# Patient Record
Sex: Female | Born: 1950 | Race: Black or African American | Hispanic: No | Marital: Married | Smoking: Never smoker
Health system: Southern US, Community
[De-identification: ages and names within clinical notes are randomized; demographics above are authoritative.]

## PROBLEM LIST (undated history)

## (undated) DIAGNOSIS — N814 Uterovaginal prolapse, unspecified: Secondary | ICD-10-CM

## (undated) DIAGNOSIS — N816 Rectocele: Secondary | ICD-10-CM

## (undated) DIAGNOSIS — K5900 Constipation, unspecified: Secondary | ICD-10-CM

## (undated) DIAGNOSIS — H35349 Macular cyst, hole, or pseudohole, unspecified eye: Secondary | ICD-10-CM

## (undated) DIAGNOSIS — N813 Complete uterovaginal prolapse: Secondary | ICD-10-CM

## (undated) DIAGNOSIS — D649 Anemia, unspecified: Secondary | ICD-10-CM

## (undated) DIAGNOSIS — T7840XA Allergy, unspecified, initial encounter: Secondary | ICD-10-CM

## (undated) HISTORY — DX: Rectocele: N81.6

## (undated) HISTORY — PX: EYE SURGERY: SHX253

## (undated) HISTORY — DX: Anemia, unspecified: D64.9

## (undated) HISTORY — DX: Allergy, unspecified, initial encounter: T78.40XA

## (undated) HISTORY — DX: Macular cyst, hole, or pseudohole, unspecified eye: H35.349

## (undated) HISTORY — DX: Complete uterovaginal prolapse: N81.3

## (undated) HISTORY — DX: Uterovaginal prolapse, unspecified: N81.4

## (undated) HISTORY — DX: Constipation, unspecified: K59.00

---

## 2003-06-22 ENCOUNTER — Ambulatory Visit (HOSPITAL_COMMUNITY): Admission: RE | Admit: 2003-06-22 | Discharge: 2003-06-22 | Payer: Self-pay | Admitting: Family Medicine

## 2004-12-17 ENCOUNTER — Ambulatory Visit (HOSPITAL_COMMUNITY): Admission: RE | Admit: 2004-12-17 | Discharge: 2004-12-17 | Payer: Self-pay | Admitting: Internal Medicine

## 2006-03-13 ENCOUNTER — Ambulatory Visit (HOSPITAL_COMMUNITY): Admission: RE | Admit: 2006-03-13 | Discharge: 2006-03-13 | Payer: Self-pay | Admitting: Family Medicine

## 2007-05-14 ENCOUNTER — Ambulatory Visit (HOSPITAL_COMMUNITY): Admission: RE | Admit: 2007-05-14 | Discharge: 2007-05-14 | Payer: Self-pay | Admitting: Internal Medicine

## 2009-02-06 ENCOUNTER — Ambulatory Visit (HOSPITAL_COMMUNITY): Admission: RE | Admit: 2009-02-06 | Discharge: 2009-02-06 | Payer: Self-pay | Admitting: Internal Medicine

## 2010-02-06 ENCOUNTER — Ambulatory Visit (HOSPITAL_COMMUNITY): Admission: RE | Admit: 2010-02-06 | Discharge: 2010-02-07 | Payer: Self-pay | Admitting: Ophthalmology

## 2010-06-05 ENCOUNTER — Ambulatory Visit (HOSPITAL_COMMUNITY): Admission: RE | Admit: 2010-06-05 | Discharge: 2010-06-05 | Payer: Self-pay | Admitting: Internal Medicine

## 2010-08-20 ENCOUNTER — Encounter: Payer: Self-pay | Admitting: Internal Medicine

## 2010-10-14 LAB — CBC
HCT: 40.3 % (ref 36.0–46.0)
Hemoglobin: 13.4 g/dL (ref 12.0–15.0)
MCH: 28 pg (ref 26.0–34.0)
MCHC: 33.2 g/dL (ref 30.0–36.0)
MCV: 84.4 fL (ref 78.0–100.0)
Platelets: 215 10*3/uL (ref 150–400)
RBC: 4.77 MIL/uL (ref 3.87–5.11)
RDW: 14.4 % (ref 11.5–15.5)
WBC: 3.2 10*3/uL — ABNORMAL LOW (ref 4.0–10.5)

## 2010-10-14 LAB — SURGICAL PCR SCREEN
MRSA, PCR: NEGATIVE
Staphylococcus aureus: NEGATIVE

## 2010-10-14 LAB — AUTOLOGOUS SERUM PATCH PREP

## 2011-06-18 ENCOUNTER — Other Ambulatory Visit (HOSPITAL_COMMUNITY): Payer: Self-pay | Admitting: Internal Medicine

## 2011-06-18 DIAGNOSIS — Z1231 Encounter for screening mammogram for malignant neoplasm of breast: Secondary | ICD-10-CM

## 2011-07-19 ENCOUNTER — Ambulatory Visit (HOSPITAL_COMMUNITY)
Admission: RE | Admit: 2011-07-19 | Discharge: 2011-07-19 | Disposition: A | Discharge status: Home / Self Care | Payer: Medicaid Other | Source: Ambulatory Visit | Attending: Internal Medicine | Admitting: Internal Medicine

## 2011-07-19 DIAGNOSIS — Z1231 Encounter for screening mammogram for malignant neoplasm of breast: Secondary | ICD-10-CM | POA: Insufficient documentation

## 2012-11-24 ENCOUNTER — Other Ambulatory Visit (HOSPITAL_COMMUNITY): Payer: Self-pay | Admitting: Internal Medicine

## 2012-11-24 DIAGNOSIS — Z1231 Encounter for screening mammogram for malignant neoplasm of breast: Secondary | ICD-10-CM

## 2012-12-03 ENCOUNTER — Ambulatory Visit (HOSPITAL_COMMUNITY)
Admission: RE | Admit: 2012-12-03 | Discharge: 2012-12-03 | Disposition: A | Discharge status: Home / Self Care | Payer: Medicaid Other | Source: Ambulatory Visit | Attending: Internal Medicine | Admitting: Internal Medicine

## 2012-12-03 DIAGNOSIS — Z1231 Encounter for screening mammogram for malignant neoplasm of breast: Secondary | ICD-10-CM | POA: Insufficient documentation

## 2013-02-18 ENCOUNTER — Encounter: Payer: Self-pay | Admitting: Obstetrics & Gynecology

## 2013-02-18 ENCOUNTER — Ambulatory Visit (INDEPENDENT_AMBULATORY_CARE_PROVIDER_SITE_OTHER): Payer: Medicaid Other | Admitting: Obstetrics & Gynecology

## 2013-02-18 VITALS — BP 173/93 | HR 77 | Temp 98.1°F | Wt 172.0 lb

## 2013-02-18 DIAGNOSIS — N816 Rectocele: Secondary | ICD-10-CM

## 2013-02-18 DIAGNOSIS — N8111 Cystocele, midline: Secondary | ICD-10-CM | POA: Insufficient documentation

## 2013-02-18 NOTE — Patient Instructions (Signed)
Patient information: Pelvic organ prolapse (The Basics)View in SpanishWritten by the doctors and editors at [employer]  What is pelvic organ prolapse? - Pelvic organ prolapse is a condition that only affects women. It happens when tissues that support the organs in the lower belly relax. As a result, the organs drop down and press against or bulge into the vagina. If the bladder bulges into the vagina, doctors call this problem "cystocele." If the rectum bulges into the vagina, they call it "rectocele." Uterine prolapse means the uterus has bulged into the vagina (figure 1). What are the symptoms of pelvic organ prolapse? - Many women with this problem have no symptoms. But some women with pelvic organ prolapse have symptoms that include:  ?Fullness or pressure in the pelvis or vagina ?A bulge in the vagina or coming out of the vagina ?Leaking urine when they laugh, cough, or sneeze ?Needing to urinate all of a sudden When they use the toilet, some women need to press on the bulge in the vagina with a finger to get out all their urine or to finish a bowel movement.  Is there a test for pelvic organ prolapse? - Your doctor or nurse will be able to tell if you have it by doing a pelvic exam.  Is there anything I can do on my own to feel better? - Yes. Some women feel better if they do pelvic muscle exercises. These exercises strengthen the muscles that control the flow of urine and bowel movements. They are also known as "Kegel" exercises. Your nurse or doctor can teach you how to do them.  How is pelvic organ prolapse treated? - Women who have no symptoms or who are not bothered by their symptoms do not need treatment. For women with symptoms that bother them, doctors suggest different treatments, including:  ?A vaginal pessary - This device fits inside a woman's vagina to support the bladder and push it back into place. Pessaries come in different shapes and sizes. ?Surgery - A surgeon can move dropped  organs back where they belong and strengthen the tissues that keep them in place. Women should have this type of surgery only if they are done having children.  Can pelvic organ prolapse be prevented? - You can reduce your chances of pelvic organ prolapse if you: ?Lose weight if you are overweight ?Get treated for constipation if you are constipated ?Avoid activities that require you to lift heavy things   

## 2013-02-18 NOTE — Progress Notes (Deleted)
Subjective:     Brittany Willis is a 62 y.o. female here for a routine exam.  Current complaints: Pt states she is concerned about a prolapse. Personal health questionnaire reviewed: yes.   Gynecologic History No LMP recorded. Patient is postmenopausal. Contraception: none Last Pap:09/ 2013. Results were: normal Last mammogram: 2014. Results were: normal  Obstetric History OB History   Grav Para Term Preterm Abortions TAB SAB Ect Mult Living                   The following portions of the patient's history were reviewed and updated as appropriate: allergies, current medications, past family history, past medical history, past social history, past surgical history and problem list.  Review of Systems {ros; complete:30496}    Objective:    {exam; complete:18323}    Assessment:    Healthy female exam.    Plan:    {plan:19193}

## 2013-02-18 NOTE — Progress Notes (Signed)
Subjective:    Brittany Willis is a 62 y.o. female who presents for evaluation of a cystocele and rectocele. Problem started 1 year ago. Symptoms include: prolapse of tissue with straining. Symptoms have gradually worsened.   Menstrual History: OB History   Grav Para Term Preterm Abortions TAB SAB Ect Mult Living                     The following portions of the patient's history were reviewed and updated as appropriate: allergies, current medications, past family history, past medical history, past social history, past surgical history and problem list.  Review of Systems Pertinent items are noted in HPI.   Objective:     BP 173/93  Pulse 77  Temp(Src) 98.1 F (36.7 C)  Wt 172 lb (78.019 kg) Pelvis:  See below  POP-Q:   Aa: +1 Ba: +1 C: -4  D: -6 Ap: -1 Bp: -1      The patient was fitted for a ring pessary   Assessment:    The patient has a cystocele and rectocele grade 2    Plan:    Discussed cystoceles/rectoceles and management options with the patient. Discussed surgical repair. The patient elects to proceed with a pessary. Return for pessary placement

## 2013-04-29 ENCOUNTER — Encounter: Payer: Self-pay | Admitting: Obstetrics & Gynecology

## 2013-04-29 ENCOUNTER — Ambulatory Visit (INDEPENDENT_AMBULATORY_CARE_PROVIDER_SITE_OTHER): Payer: Medicaid Other | Admitting: Obstetrics & Gynecology

## 2013-04-29 VITALS — BP 121/81 | HR 61 | Temp 98.3°F | Wt 176.0 lb

## 2013-04-29 DIAGNOSIS — N8111 Cystocele, midline: Secondary | ICD-10-CM

## 2013-04-29 MED ORDER — ESTRADIOL 0.1 MG/GM VA CREA: 1.5200 g | TOPICAL_CREAM | VAGINAL | Status: DC

## 2013-04-29 NOTE — Progress Notes (Signed)
Subjective:     Brittany Willis is a 62 y.o. female here for a routine exam.  Current complaints: in office today for a pessary check.  Personal health questionnaire reviewed: yes.   Gynecologic History No LMP recorded. Patient is postmenopausal. Contraception: abstinence   Obstetric History OB History  No data available     The following portions of the patient's history were reviewed and updated as appropriate: allergies, current medications, past family history, past medical history, past social history, past surgical history and problem list.  Review of Systems Pertinent items are noted in HPI.    Objective:     Pelvic: a ring pessary was placed     Assessment:   POP   Plan:   Topical E2 Return in 1 week

## 2013-05-05 ENCOUNTER — Encounter: Payer: Self-pay | Admitting: Obstetrics

## 2013-05-06 ENCOUNTER — Ambulatory Visit: Payer: Medicaid Other | Admitting: Obstetrics & Gynecology

## 2013-12-13 ENCOUNTER — Other Ambulatory Visit (HOSPITAL_COMMUNITY): Payer: Self-pay | Admitting: Internal Medicine

## 2013-12-13 DIAGNOSIS — Z1231 Encounter for screening mammogram for malignant neoplasm of breast: Secondary | ICD-10-CM

## 2013-12-23 ENCOUNTER — Ambulatory Visit (HOSPITAL_COMMUNITY)
Admission: RE | Admit: 2013-12-23 | Discharge: 2013-12-23 | Disposition: A | Discharge status: Home / Self Care | Payer: BC Managed Care – PPO | Source: Ambulatory Visit | Attending: Internal Medicine | Admitting: Internal Medicine

## 2013-12-23 ENCOUNTER — Encounter (INDEPENDENT_AMBULATORY_CARE_PROVIDER_SITE_OTHER): Payer: Self-pay

## 2013-12-23 DIAGNOSIS — Z1231 Encounter for screening mammogram for malignant neoplasm of breast: Secondary | ICD-10-CM | POA: Insufficient documentation

## 2014-02-04 ENCOUNTER — Telehealth: Payer: Self-pay | Admitting: *Deleted

## 2014-02-09 NOTE — Telephone Encounter (Signed)
error 

## 2014-04-21 ENCOUNTER — Ambulatory Visit: Payer: BC Managed Care – PPO | Attending: Family Medicine

## 2014-06-15 ENCOUNTER — Ambulatory Visit: Payer: Self-pay | Attending: Internal Medicine | Admitting: Internal Medicine

## 2014-06-15 ENCOUNTER — Encounter: Payer: Self-pay | Admitting: Internal Medicine

## 2014-06-15 VITALS — BP 158/93 | HR 68 | Temp 98.0°F | Resp 16 | Ht 65.0 in | Wt 176.0 lb

## 2014-06-15 DIAGNOSIS — K5900 Constipation, unspecified: Secondary | ICD-10-CM

## 2014-06-15 DIAGNOSIS — N8110 Cystocele, unspecified: Secondary | ICD-10-CM | POA: Insufficient documentation

## 2014-06-15 DIAGNOSIS — H35341 Macular cyst, hole, or pseudohole, right eye: Secondary | ICD-10-CM | POA: Insufficient documentation

## 2014-06-15 DIAGNOSIS — IMO0001 Reserved for inherently not codable concepts without codable children: Secondary | ICD-10-CM

## 2014-06-15 DIAGNOSIS — N816 Rectocele: Secondary | ICD-10-CM

## 2014-06-15 DIAGNOSIS — Z0000 Encounter for general adult medical examination without abnormal findings: Secondary | ICD-10-CM

## 2014-06-15 DIAGNOSIS — IMO0002 Reserved for concepts with insufficient information to code with codable children: Secondary | ICD-10-CM

## 2014-06-15 DIAGNOSIS — Z2821 Immunization not carried out because of patient refusal: Secondary | ICD-10-CM

## 2014-06-15 DIAGNOSIS — R030 Elevated blood-pressure reading, without diagnosis of hypertension: Secondary | ICD-10-CM | POA: Insufficient documentation

## 2014-06-15 LAB — POCT URINALYSIS DIPSTICK
Bilirubin, UA: NEGATIVE
Blood, UA: NEGATIVE
Glucose, UA: NEGATIVE
Leukocytes, UA: NEGATIVE
Nitrite, UA: NEGATIVE
Protein, UA: 30
Spec Grav, UA: 1.025
Urobilinogen, UA: 0.2
pH, UA: 6

## 2014-06-15 MED ORDER — POLYETHYLENE GLYCOL 3350 17 GM/SCOOP PO POWD: 17.0000 g | Freq: Every day | ORAL | Status: DC

## 2014-06-15 NOTE — Progress Notes (Signed)
Patient ID: Brittany Willis, female   DOB: 1951/05/11, 63 y.o.   MRN: [id number]  FYB:[id number]  NID:[id number]  DOB - [birth year]  CC:  Chief Complaint  Patient presents with  . Establish Care       HPI: Brittany Willis is a 63 y.o. female here today to establish medical care.  Patient presents to clinic with concerns of lower abdominal pain beginning on Sunday.  She states that she has been wearing her pessary ring to hold her bladder in place to prevent prolapse.  She states that she took the ring out on Sunday because she was having pain that felt like gas moving in her abdomen.  The pain did improve since taking the ring out.  She c/o lower back pain and constipation intermittently when she becomes stressed.  She describes her abdominal pain as a sharp pain similar to cramps. The pain sometimes radiates to her vagina.  The back pain is described as a sharp pain in her back as well.    Had hole behind right eye, macular hole.  Patient has follow up appointment in the next two weeks.  Allergies  Allergen Reactions  . Aspirin Swelling   History reviewed. No pertinent past medical history. Current Outpatient Prescriptions on File Prior to Visit  Medication Sig Dispense Refill  . estradiol (ESTRACE VAGINAL) 0.1 MG/GM vaginal cream Place 1.44 Applicatorfuls vaginally 2 (two) times a week. 42.5 g 12  . loteprednol (LOTEMAX) 0.2 % SUSP 1 drop 4 (four) times daily.    . solifenacin (VESICARE) 5 MG tablet Take 10 mg by mouth daily.     No current facility-administered medications on file prior to visit.   Family History  Problem Relation Age of Onset  . Cancer Mother    History   Social History  . Marital Status: Married    Spouse Name: N/A    Number of Children: N/A  . Years of Education: N/A   Occupational History  . Not on file.   Social History Main Topics  . Smoking status: Never Smoker   . Smokeless tobacco: Not on file  . Alcohol Use: No  . Drug Use: No  . Sexual Activity: No    Other Topics Concern  . Not on file   Social History Narrative    Review of Systems: See HPI   Objective:   Filed Vitals:   06/15/14 1616  BP: 158/93  Pulse: 68  Temp: 98 F (36.7 C)  Resp: 16    Physical Exam  Constitutional: She is oriented to person, place, and time.  Cardiovascular: Normal rate, regular rhythm and normal heart sounds.   Pulmonary/Chest: Breath sounds normal.  Abdominal: Soft. Bowel sounds are normal. There is tenderness.  Musculoskeletal: Normal range of motion.  Neurological: She is alert and oriented to person, place, and time.  Skin: Skin is warm and dry.  Psychiatric: She has a normal mood and affect.   .  Lab Results  Component Value Date   WBC 3.2* 02/06/2010   HGB 13.4 02/06/2010   HCT 40.3 02/06/2010   MCV 84.4 02/06/2010   PLT 215 02/06/2010   No results found for: CREATININE, BUN, NA, K, CL, CO2  No results found for: HGBA1C Lipid Panel  No results found for: CHOL, TRIG, HDL, CHOLHDL, VLDL, LDLCALC     Assessment and plan:   Brittany Willis was seen today for establish care.  Diagnoses and associated orders for this visit:  Cystocele/Rectocele - Ambulatory referral to Gynecology  Elevated  BP Patient blood pressure elevated today, will defer adding BP medication and have patient to return in 2 weeks for blood pressure recheck with nurse. Stressed diet changes, regular exercise regimen, and modifiable risk factors. Will follow up with CMP as needed. She will need medication if she returns with a elevated BP    Constipation, unspecified constipation type - polyethylene glycol powder (GLYCOLAX/MIRALAX) powder; Take 17 g by mouth daily. Likely due to pelvic organ prolapse  Preventative health care - Urinalysis Dipstick  Refused influenza vaccine Explained that annual influenza is recommended per CDC guidelines and is highly suggested to anyone who has has CHF, COPD, DM or immunocompromised. Benefits of influenza described in  detail.    Return for 2 weeks lab and RN visit-BP check.     Chari Manning, [facility] [phone] 06/15/2014, 4:47 PM

## 2014-06-15 NOTE — Progress Notes (Signed)
Pt is here to establish care. Pt reports possibly having a UTI. Pt is also having severe occasional lower back pain.

## 2014-06-29 ENCOUNTER — Encounter: Payer: Self-pay | Admitting: Obstetrics & Gynecology

## 2014-07-04 ENCOUNTER — Ambulatory Visit: Payer: Self-pay | Attending: Internal Medicine | Admitting: *Deleted

## 2014-07-04 VITALS — BP 122/76 | HR 64 | Temp 98.2°F | Resp 16

## 2014-07-04 DIAGNOSIS — Z09 Encounter for follow-up examination after completed treatment for conditions other than malignant neoplasm: Secondary | ICD-10-CM

## 2014-07-04 DIAGNOSIS — Z0130 Encounter for examination of blood pressure without abnormal findings: Secondary | ICD-10-CM | POA: Insufficient documentation

## 2014-07-04 NOTE — Addendum Note (Signed)
Addended by: Velora Heckler on: 07/04/2014 04:05 PM   Modules accepted: Orders

## 2014-07-04 NOTE — Progress Notes (Addendum)
Patient presents for BP check Med list reviewed; states taking miralax as needed Patient has appt with GYN 08/10/14 Patient states she had surgery on right eye Requesting referral for opthalmology to f/u  BP 122/76 P 64 R 16  T 98.2 oral SPO2 99%  Referral to opthalmology placed  Patient aware that she is to f/u with PCP 3 months from last visit

## 2014-07-11 ENCOUNTER — Ambulatory Visit: Payer: Self-pay | Attending: Internal Medicine

## 2014-08-10 ENCOUNTER — Ambulatory Visit (INDEPENDENT_AMBULATORY_CARE_PROVIDER_SITE_OTHER): Payer: Self-pay | Admitting: Obstetrics & Gynecology

## 2014-08-10 ENCOUNTER — Encounter: Payer: Self-pay | Admitting: Obstetrics & Gynecology

## 2014-08-10 VITALS — BP 125/66 | HR 67 | Temp 97.8°F | Ht 65.0 in | Wt 175.0 lb

## 2014-08-10 DIAGNOSIS — N814 Uterovaginal prolapse, unspecified: Secondary | ICD-10-CM

## 2014-08-10 NOTE — Progress Notes (Signed)
   Subjective:    Patient ID: Brittany Willis, female    DOB: October 24, 1950, 64 y.o.   MRN: [id number]  HPI 64 yo M AA P1 (69 yo son) here for follow up after a pessary was placed by Dr. Delsa Sale in 2014. She no longer has insurance and that is why she is here. She was prescribed vaginal estrogen but doesn't use it. She takes the pessary out once per week and leaves it out for about 3 days.    Review of Systems Her husband is in a wheelchair and she has to lift him and his wheelchair which caused her prolapse. Her last mammogram was last year Last pap 2014, always normal.    Objective:   Physical Exam Healthy vagina. No excoriations.       Assessment & Plan:  Prolapse- continue pessary

## 2015-03-01 ENCOUNTER — Ambulatory Visit: Payer: Self-pay | Attending: Internal Medicine | Admitting: Internal Medicine

## 2015-03-01 ENCOUNTER — Encounter: Payer: Self-pay | Admitting: Internal Medicine

## 2015-03-01 VITALS — BP 131/79 | HR 60 | Temp 98.2°F | Resp 16 | Wt 173.8 lb

## 2015-03-01 DIAGNOSIS — Z0000 Encounter for general adult medical examination without abnormal findings: Secondary | ICD-10-CM

## 2015-03-01 DIAGNOSIS — H35349 Macular cyst, hole, or pseudohole, unspecified eye: Secondary | ICD-10-CM | POA: Insufficient documentation

## 2015-03-01 DIAGNOSIS — Z1211 Encounter for screening for malignant neoplasm of colon: Secondary | ICD-10-CM

## 2015-03-01 DIAGNOSIS — Z23 Encounter for immunization: Secondary | ICD-10-CM

## 2015-03-01 LAB — LIPID PANEL
Cholesterol: 210 mg/dL — ABNORMAL HIGH (ref 125–200)
HDL: 50 mg/dL (ref >=46)
LDL Cholesterol: 131 mg/dL — ABNORMAL HIGH (ref <130)
Total CHOL/HDL Ratio: 4.2 Ratio (ref <=5.0)
Triglycerides: 146 mg/dL (ref <150)
VLDL: 29 mg/dL (ref <30)

## 2015-03-01 LAB — COMPLETE METABOLIC PANEL WITH GFR
ALT: 16 U/L (ref 6–29)
AST: 19 U/L (ref 10–35)
Albumin: 4.2 g/dL (ref 3.6–5.1)
Alkaline Phosphatase: 56 U/L (ref 33–130)
BUN: 11 mg/dL (ref 7–25)
CO2: 26 mmol/L (ref 20–31)
Calcium: 9.8 mg/dL (ref 8.6–10.4)
Chloride: 104 mmol/L (ref 98–110)
Creat: 0.82 mg/dL (ref 0.50–0.99)
GFR, Est African American: 88 mL/min (ref >=60)
GFR, Est Non African American: 76 mL/min (ref >=60)
Glucose, Bld: 89 mg/dL (ref 65–99)
Potassium: 4.4 mmol/L (ref 3.5–5.3)
Sodium: 142 mmol/L (ref 135–146)
Total Bilirubin: 0.5 mg/dL (ref 0.2–1.2)
Total Protein: 6.9 g/dL (ref 6.1–8.1)

## 2015-03-01 LAB — TSH: TSH: 1.587 u[IU]/mL (ref 0.350–4.500)

## 2015-03-01 LAB — CBC
HCT: 40.9 % (ref 36.0–46.0)
Hemoglobin: 13.6 g/dL (ref 12.0–15.0)
MCH: 26.9 pg (ref 26.0–34.0)
MCHC: 33.3 g/dL (ref 30.0–36.0)
MCV: 80.8 fL (ref 78.0–100.0)
MPV: 10.4 fL (ref 8.6–12.4)
Platelets: 232 10*3/uL (ref 150–400)
RBC: 5.06 MIL/uL (ref 3.87–5.11)
RDW: 14.3 % (ref 11.5–15.5)
WBC: 3.4 10*3/uL — ABNORMAL LOW (ref 4.0–10.5)

## 2015-03-01 LAB — HEMOGLOBIN A1C
Hgb A1c MFr Bld: 5.8 % — ABNORMAL HIGH (ref <5.7)
Mean Plasma Glucose: 120 mg/dL — ABNORMAL HIGH (ref <117)

## 2015-03-01 NOTE — Progress Notes (Signed)
Here for check up No concerns

## 2015-03-01 NOTE — Progress Notes (Signed)
Patient ID: Brittany Willis, female   DOB: 02-21-1951, 64 y.o.   MRN: [id number]  CC: annual physical   HPI: Brittany Willis is a 64 y.o. female here today for a follow up visit.  Patient has past medical history of cystocele and macular hole of right eye. Patient reports that she is not on any current medications and has not had any changes in medical history since last exam. She continues to wear a pessary for bladder prolapse. She is due for her annual mammogram. She is not up to date on colonoscopy or Tdap vaccination. Her only concern today is about left ankle pain/heel pain upon getting out of bed in the morning. The pain is a ache and is also aggravated by standing for prolonged periods of time.  Allergies  Allergen Reactions  . Aspirin Swelling   No past medical history on file. Current Outpatient Prescriptions on File Prior to Visit  Medication Sig Dispense Refill  . loteprednol (LOTEMAX) 0.2 % SUSP 1 drop 4 (four) times daily.    . Multiple Vitamin (MULTIVITAMIN) capsule Take 1 capsule by mouth daily.    . polyethylene glycol powder (GLYCOLAX/MIRALAX) powder Take 17 g by mouth daily. 850 g 3   No current facility-administered medications on file prior to visit.   Family History  Problem Relation Age of Onset  . Cancer Mother    History   Social History  . Marital Status: Married    Spouse Name: N/A  . Number of Children: N/A  . Years of Education: N/A   Occupational History  . Not on file.   Social History Main Topics  . Smoking status: Never Smoker   . Smokeless tobacco: Not on file  . Alcohol Use: No  . Drug Use: No  . Sexual Activity: No   Other Topics Concern  . Not on file   Social History Narrative    Review of Systems: Constitutional: Negative for fever, chills, diaphoresis, activity change, appetite change and fatigue. HENT: Negative for ear pain, nosebleeds, congestion, facial swelling, rhinorrhea, neck pain, neck stiffness and ear discharge.  Eyes:  Negative for pain, discharge, redness, itching and visual disturbance. Respiratory: Negative for cough, choking, chest tightness, shortness of breath, wheezing and stridor.  Cardiovascular: Negative for chest pain, palpitations and leg swelling. Gastrointestinal: Negative for abdominal distention. Genitourinary: Negative for dysuria, urgency, frequency, hematuria, flank pain, decreased urine volume, difficulty urinating and dyspareunia.  Musculoskeletal: Negative for back pain, joint swelling, arthralgias and gait problem. +left ankle, heel pain Neurological: Negative for dizziness, tremors, seizures, syncope, facial asymmetry, speech difficulty, weakness, light-headedness, numbness and headaches.  Hematological: Negative for adenopathy. Does not bruise/bleed easily. Psychiatric/Behavioral: Negative for hallucinations, behavioral problems, confusion, dysphoric mood, decreased concentration and agitation.    Objective:   Filed Vitals:   03/01/15 1128  BP: 131/79  Pulse: 60  Temp: 98.2 F (36.8 C)  Resp: 16    Physical Exam: Constitutional: Patient appears well-developed and well-nourished. No distress. HENT: Normocephalic, atraumatic, External right and left ear normal. Oropharynx is clear and moist.  Eyes: Conjunctivae and EOM are normal. PERRLA, no scleral icterus. Neck: Normal ROM. Neck supple. No JVD. No tracheal deviation. No thyromegaly. CVS: RRR, S1/S2 +, no murmurs, no gallops, no carotid bruit.  Pulmonary: Effort and breath sounds normal, no stridor, rhonchi, wheezes, rales.  Abdominal: Soft. BS +,  no distension, tenderness, rebound or guarding.  Musculoskeletal: Normal range of motion. No edema. No heel tenderness with palpitation. Lymphadenopathy: No lymphadenopathy noted, cervical, inguinal or  axillary Neuro: Alert. Normal reflexes, muscle tone coordination. No cranial nerve deficit. Skin: Skin is warm and dry. No rash noted. Not diaphoretic. No erythema. No  pallor. Psychiatric: Normal mood and affect. Behavior, judgment, thought content normal. Breasts: breasts appear normal, no suspicious masses, no skin or nipple changes or axillary nodes  Lab Results  Component Value Date   WBC 3.2* 02/06/2010   HGB 13.4 02/06/2010   HCT 40.3 02/06/2010   MCV 84.4 02/06/2010   PLT 215 02/06/2010   No results found for: CREATININE, BUN, NA, K, CL, CO2  No results found for: HGBA1C Lipid Panel  No results found for: CHOL, TRIG, HDL, CHOLHDL, VLDL, LDLCALC     Assessment and plan:   Brittany Willis was seen today for follow-up.  Diagnoses and all orders for this visit:  Annual physical exam Orders: -     Lipid panel -     CBC -     COMPLETE METABOLIC PANEL WITH GFR -     Hemoglobin A1c -     TSH  Need for Tdap vaccination Orders: -     Tdap vaccine greater than or equal to 7yo IM  Colon cancer screening Orders: -     Ambulatory referral to Gastroenterology   Return if symptoms worsen or fail to improve.       Lance Bosch, [facility] [phone] 03/01/2015, 11:45 AM

## 2015-03-01 NOTE — Patient Instructions (Signed)
Fat and Cholesterol Control Diet Fat and cholesterol levels in your blood and organs are influenced by your diet. High levels of fat and cholesterol may lead to diseases of the heart, small and large blood vessels, gallbladder, liver, and pancreas. CONTROLLING FAT AND CHOLESTEROL WITH DIET Although exercise and lifestyle factors are important, your diet is key. That is because certain foods are known to raise cholesterol and others to lower it. The goal is to balance foods for their effect on cholesterol and more importantly, to replace saturated and trans fat with other types of fat, such as monounsaturated fat, polyunsaturated fat, and omega-3 fatty acids. On average, a person should consume no more than 15 to 17 g of saturated fat daily. Saturated and trans fats are considered "bad" fats, and they will raise LDL cholesterol. Saturated fats are primarily found in animal products such as meats, butter, and cream. However, that does not mean you need to give up all your favorite foods. Today, there are good tasting, low-fat, low-cholesterol substitutes for most of the things you like to eat. Choose low-fat or nonfat alternatives. Choose round or loin cuts of red meat. These types of cuts are lowest in fat and cholesterol. Chicken (without the skin), fish, veal, and ground turkey breast are great choices. Eliminate fatty meats, such as hot dogs and salami. Even shellfish have little or no saturated fat. Have a 3 oz (85 g) portion when you eat lean meat, poultry, or fish. Trans fats are also called "partially hydrogenated oils." They are oils that have been scientifically manipulated so that they are solid at room temperature resulting in a longer shelf life and improved taste and texture of foods in which they are added. Trans fats are found in stick margarine, some tub margarines, cookies, crackers, and baked goods.  When baking and cooking, oils are a great substitute for butter. The monounsaturated oils are  especially beneficial since it is believed they lower LDL and raise HDL. The oils you should avoid entirely are saturated tropical oils, such as coconut and palm.  Remember to eat a lot from food groups that are naturally free of saturated and trans fat, including fish, fruit, vegetables, beans, grains (barley, rice, couscous, bulgur wheat), and pasta (without cream sauces).  IDENTIFYING FOODS THAT LOWER FAT AND CHOLESTEROL  Soluble fiber may lower your cholesterol. This type of fiber is found in fruits such as apples, vegetables such as broccoli, potatoes, and carrots, legumes such as beans, peas, and lentils, and grains such as barley. Foods fortified with plant sterols (phytosterol) may also lower cholesterol. You should eat at least 2 g per day of these foods for a cholesterol lowering effect.  Read package labels to identify low-saturated fats, trans fat free, and low-fat foods at the supermarket. Select cheeses that have only 2 to 3 g saturated fat per ounce. Use a heart-healthy tub margarine that is free of trans fats or partially hydrogenated oil. When buying baked goods (cookies, crackers), avoid partially hydrogenated oils. Breads and muffins should be made from whole grains (whole-wheat or whole oat flour, instead of "flour" or "enriched flour"). Buy non-creamy canned soups with reduced salt and no added fats.  FOOD PREPARATION TECHNIQUES  Never deep-fry. If you must fry, either stir-fry, which uses very little fat, or use non-stick cooking sprays. When possible, broil, bake, or roast meats, and steam vegetables. Instead of putting butter or margarine on vegetables, use lemon and herbs, applesauce, and cinnamon (for squash and sweet potatoes). Use nonfat   yogurt, salsa, and low-fat dressings for salads.  LOW-SATURATED FAT / LOW-FAT FOOD SUBSTITUTES Meats / Saturated Fat (g)  Avoid: Steak, marbled (3 oz/85 g) / 11 g  Choose: Steak, lean (3 oz/85 g) / 4 g  Avoid: Hamburger (3 oz/85 g) / 7  g  Choose: Hamburger, lean (3 oz/85 g) / 5 g  Avoid: Ham (3 oz/85 g) / 6 g  Choose: Ham, lean cut (3 oz/85 g) / 2.4 g  Avoid: Chicken, with skin, dark meat (3 oz/85 g) / 4 g  Choose: Chicken, skin removed, dark meat (3 oz/85 g) / 2 g  Avoid: Chicken, with skin, light meat (3 oz/85 g) / 2.5 g  Choose: Chicken, skin removed, light meat (3 oz/85 g) / 1 g Dairy / Saturated Fat (g)  Avoid: Whole milk (1 cup) / 5 g  Choose: Low-fat milk, 2% (1 cup) / 3 g  Choose: Low-fat milk, 1% (1 cup) / 1.5 g  Choose: Skim milk (1 cup) / 0.3 g  Avoid: Hard cheese (1 oz/28 g) / 6 g  Choose: Skim milk cheese (1 oz/28 g) / 2 to 3 g  Avoid: Cottage cheese, 4% fat (1 cup) / 6.5 g  Choose: Low-fat cottage cheese, 1% fat (1 cup) / 1.5 g  Avoid: Ice cream (1 cup) / 9 g  Choose: Sherbet (1 cup) / 2.5 g  Choose: Nonfat frozen yogurt (1 cup) / 0.3 g  Choose: Frozen fruit bar / trace  Avoid: Whipped cream (1 tbs) / 3.5 g  Choose: Nondairy whipped topping (1 tbs) / 1 g Condiments / Saturated Fat (g)  Avoid: Mayonnaise (1 tbs) / 2 g  Choose: Low-fat mayonnaise (1 tbs) / 1 g  Avoid: Butter (1 tbs) / 7 g  Choose: Extra light margarine (1 tbs) / 1 g  Avoid: Coconut oil (1 tbs) / 11.8 g  Choose: Olive oil (1 tbs) / 1.8 g  Choose: Corn oil (1 tbs) / 1.7 g  Choose: Safflower oil (1 tbs) / 1.2 g  Choose: Sunflower oil (1 tbs) / 1.4 g  Choose: Soybean oil (1 tbs) / 2.4 g  Choose: Canola oil (1 tbs) / 1 g Document Released: 07/15/2005 Document Revised: 11/09/2012 Document Reviewed: 10/13/2013 [employer] Patient Information 2015 [employer]. This information is not intended to replace advice given to you by your health care provider. Make sure you discuss any questions you have with your health care provider.  

## 2015-03-06 ENCOUNTER — Telehealth: Payer: Self-pay

## 2015-03-06 ENCOUNTER — Other Ambulatory Visit: Payer: Self-pay | Admitting: Internal Medicine

## 2015-03-06 DIAGNOSIS — E785 Hyperlipidemia, unspecified: Secondary | ICD-10-CM | POA: Insufficient documentation

## 2015-03-06 MED ORDER — ATORVASTATIN CALCIUM 10 MG PO TABS: 10.0000 mg | ORAL_TABLET | Freq: Every day | ORAL | Status: DC

## 2015-03-06 NOTE — Telephone Encounter (Signed)
Patient is aware of her lab results 

## 2015-03-06 NOTE — Telephone Encounter (Signed)
-----   Message from Lance Bosch, NP sent at 03/06/2015  3:09 PM EDT ----- Prediabetic--educate on diet, carb modification, and weight. Also cholesterol is elevated. Please have her begin on atorvastatin 10 mg daily, order has been sent. Please explain breads, pasta, rice, potatoes, fried foods all elevated cholesterol. I will recheck cholesterol in 6 months

## 2015-03-09 ENCOUNTER — Other Ambulatory Visit: Payer: Self-pay | Admitting: Internal Medicine

## 2015-03-09 DIAGNOSIS — Z1231 Encounter for screening mammogram for malignant neoplasm of breast: Secondary | ICD-10-CM

## 2015-03-20 ENCOUNTER — Ambulatory Visit (HOSPITAL_COMMUNITY)
Admission: RE | Admit: 2015-03-20 | Discharge: 2015-03-20 | Disposition: A | Discharge status: Home / Self Care | Payer: Self-pay | Source: Ambulatory Visit | Attending: Internal Medicine | Admitting: Internal Medicine

## 2015-03-20 DIAGNOSIS — Z1231 Encounter for screening mammogram for malignant neoplasm of breast: Secondary | ICD-10-CM

## 2015-03-22 ENCOUNTER — Telehealth: Payer: Self-pay

## 2015-03-22 NOTE — Telephone Encounter (Signed)
-----   Message from Lance Bosch, NP sent at 03/21/2015  9:27 PM EDT ----- Mammogram negative, Repeat in 1 year

## 2015-03-22 NOTE — Telephone Encounter (Signed)
Patient not available Left message on voice mail to return our call 

## 2015-05-05 ENCOUNTER — Ambulatory Visit: Payer: Self-pay

## 2015-05-12 ENCOUNTER — Ambulatory Visit: Payer: Self-pay | Attending: Internal Medicine

## 2015-06-29 ENCOUNTER — Other Ambulatory Visit: Payer: Self-pay | Admitting: Internal Medicine

## 2015-08-31 MED FILL — POLYETHYLENE GLYCOL 3350: 30 days supply | Qty: 510 | Fill #1

## 2015-10-02 ENCOUNTER — Ambulatory Visit: Payer: Self-pay | Attending: Internal Medicine | Admitting: Internal Medicine

## 2015-10-02 ENCOUNTER — Encounter: Payer: Self-pay | Admitting: Internal Medicine

## 2015-10-02 VITALS — BP 115/69 | HR 69 | Temp 98.0°F | Resp 16 | Ht 65.0 in | Wt 178.4 lb

## 2015-10-02 DIAGNOSIS — Z888 Allergy status to other drugs, medicaments and biological substances status: Secondary | ICD-10-CM | POA: Insufficient documentation

## 2015-10-02 DIAGNOSIS — E785 Hyperlipidemia, unspecified: Secondary | ICD-10-CM | POA: Insufficient documentation

## 2015-10-02 DIAGNOSIS — Z79899 Other long term (current) drug therapy: Secondary | ICD-10-CM | POA: Insufficient documentation

## 2015-10-02 DIAGNOSIS — N814 Uterovaginal prolapse, unspecified: Secondary | ICD-10-CM | POA: Insufficient documentation

## 2015-10-02 MED ORDER — ATORVASTATIN CALCIUM 10 MG PO TABS: 10.0000 mg | ORAL_TABLET | Freq: Every day | ORAL | Status: DC

## 2015-10-02 MED FILL — ?ATORVASTATIN 10 MG TABLET: 10 | 30 days supply | Qty: 30 | Fill #0

## 2015-10-02 NOTE — Progress Notes (Signed)
Patient here for follow up on her cholesterol And for medication refill

## 2015-10-02 NOTE — Patient Instructions (Signed)
Fat and Cholesterol Restricted Diet  Getting too much fat and cholesterol in your diet may cause health problems. Following this diet helps keep your fat and cholesterol at normal levels. This can keep you from getting sick.  WHAT TYPES OF FAT SHOULD I CHOOSE?  · Choose monosaturated and polyunsaturated fats. These are found in foods such as olive oil, canola oil, flaxseeds, walnuts, almonds, and seeds.  · Eat more omega-3 fats. Good choices include salmon, mackerel, sardines, tuna, flaxseed oil, and ground flaxseeds.  · Limit saturated fats. These are in animal products such as meats, butter, and cream. They can also be in plant products such as palm oil, palm kernel oil, and coconut oil.  ·  Avoid foods with partially hydrogenated oils in them. These contain trans fats. Examples of foods that have trans fats are stick margarine, some tub margarines, cookies, crackers, and other baked goods.  WHAT GENERAL GUIDELINES DO I NEED TO FOLLOW?   · Check food labels. Look for the words "trans fat" and "saturated fat."  · When preparing a meal:    Fill half of your plate with vegetables and green salads.    Fill one fourth of your plate with whole grains. Look for the word "whole" as the first word in the ingredient list.    Fill one fourth of your plate with lean protein foods.  · Limit fruit to two servings a day. Choose fruit instead of juice.  · Eat more foods with soluble fiber. Examples of foods with this type of fiber are apples, broccoli, carrots, beans, peas, and barley. Try to get 20-30 g (grams) of fiber per day.  · Eat more home-cooked foods. Eat less at restaurants and buffets.  · Limit or avoid alcohol.  · Limit foods high in starch and sugar.  · Limit fried foods.  · Cook foods without frying them. Baking, boiling, grilling, and broiling are all great options.  · Lose weight if you are overweight. Losing even a small amount of weight can help your overall health. It can also help prevent diseases such as  diabetes and heart disease.  WHAT FOODS CAN I EAT?  Grains  Whole grains, such as whole wheat or whole grain breads, crackers, cereals, and pasta. Unsweetened oatmeal, bulgur, barley, quinoa, or brown rice. Corn or whole wheat flour tortillas.  Vegetables  Fresh or frozen vegetables (raw, steamed, roasted, or grilled). Green salads.  Fruits  All fresh, canned (in natural juice), or frozen fruits.  Meat and Other Protein Products  Ground beef (85% or leaner), grass-fed beef, or beef trimmed of fat. Skinless chicken or turkey. Ground chicken or turkey. Pork trimmed of fat. All fish and seafood. Eggs. Dried beans, peas, or lentils. Unsalted nuts or seeds. Unsalted canned or dry beans.  Dairy  Low-fat dairy products, such as skim or 1% milk, 2% or reduced-fat cheeses, low-fat ricotta or cottage cheese, or plain low-fat yogurt.  Fats and Oils  Tub margarines without trans fats. Light or reduced-fat mayonnaise and salad dressings. Avocado. Olive, canola, sesame, or safflower oils. Natural peanut or almond butter (choose ones without added sugar and oil).  The items listed above may not be a complete list of recommended foods or beverages. Contact your dietitian for more options.  WHAT FOODS ARE NOT RECOMMENDED?  Grains  White bread. White pasta. White rice. Cornbread. Bagels, pastries, and croissants. Crackers that contain trans fat.  Vegetables  White potatoes. Corn. Creamed or fried vegetables. Vegetables in a cheese sauce.    Fruits  Dried fruits. Canned fruit in light or heavy syrup. Fruit juice.  Meat and Other Protein Products  Fatty cuts of meat. Ribs, chicken wings, bacon, sausage, bologna, salami, chitterlings, fatback, hot dogs, bratwurst, and packaged luncheon meats. Liver and organ meats.  Dairy  Whole or 2% milk, cream, half-and-half, and cream cheese. Whole milk cheeses. Whole-fat or sweetened yogurt. Full-fat cheeses. Nondairy creamers and whipped toppings. Processed cheese, cheese spreads, or cheese  curds.  Sweets and Desserts  Corn syrup, sugars, honey, and molasses. Candy. Jam and jelly. Syrup. Sweetened cereals. Cookies, pies, cakes, donuts, muffins, and ice cream.  Fats and Oils  Butter, stick margarine, lard, shortening, ghee, or bacon fat. Coconut, palm kernel, or palm oils.  Beverages  Alcohol. Sweetened drinks (such as sodas, lemonade, and fruit drinks or punches).  The items listed above may not be a complete list of foods and beverages to avoid. Contact your dietitian for more information.     This information is not intended to replace advice given to you by your health care provider. Make sure you discuss any questions you have with your health care provider.     Document Released: 01/14/2012 Document Revised: 08/05/2014 Document Reviewed: 10/14/2013  Elsevier Interactive Patient Education ©2016 [employer]

## 2015-10-02 NOTE — Progress Notes (Signed)
Patient ID: Brittany Willis, female   DOB: 09-23-1950, 65 y.o.   MRN: [id number]   CC: Follow up  HPI: Brittany Willis is a 65 y.o. female here today for a follow up visit.  Patient has past medical history of hyperlipidemia and uterine prolapse.   HYPERLIPIDEMIA Symptoms Chest pain on exertion:  No   Leg claudication:   No Medications (modifying factor): Atorvastatin---states that she only takes once or twice per week because she does not like taking pill. Compliance- nonadherent Right upper quadrant pain- No  Muscle aches- No Duration - years   Timing - continuous No other complaints today.      Component Value Date/Time   CHOL 210* 03/01/2015 1203   TRIG 146 03/01/2015 1203   HDL 50 03/01/2015 1203   VLDL 29 03/01/2015 1203   CHOLHDL 4.2 03/01/2015 1203    Allergies  Allergen Reactions  . Aspirin Swelling   History reviewed. No pertinent past medical history. Current Outpatient Prescriptions on File Prior to Visit  Medication Sig Dispense Refill  . atorvastatin (LIPITOR) 10 MG tablet Take 1 tablet (10 mg total) by mouth daily at 6 PM. For cholesterol 90 tablet 3  . loteprednol (LOTEMAX) 0.2 % SUSP 1 drop 4 (four) times daily.    . Multiple Vitamin (MULTIVITAMIN) capsule Take 1 capsule by mouth daily.    . polyethylene glycol powder (GLYCOLAX/MIRALAX) powder TAKE 17 G BY MOUTH DAILY. 850 g 3   No current facility-administered medications on file prior to visit.   Family History  Problem Relation Age of Onset  . Cancer Mother    Social History   Social History  . Marital Status: Married    Spouse Name: N/A  . Number of Children: N/A  . Years of Education: N/A   Occupational History  . Not on file.   Social History Main Topics  . Smoking status: Never Smoker   . Smokeless tobacco: Not on file  . Alcohol Use: No  . Drug Use: No  . Sexual Activity: No   Other Topics Concern  . Not on file   Social History Narrative    Review of Systems: Constitutional: Negative  for fever, chills, diaphoresis, activity change, appetite change and fatigue. HENT: Negative for ear pain, nosebleeds, congestion, facial swelling, rhinorrhea, neck pain, neck stiffness and ear discharge.  Eyes: Negative for pain, discharge, redness, itching and visual disturbance. Respiratory: Negative for cough, choking, chest tightness, shortness of breath, wheezing and stridor.  Cardiovascular: Negative for chest pain, palpitations and leg swelling. Gastrointestinal: Negative for abdominal distention. Genitourinary: Negative for dysuria, urgency, frequency, hematuria, flank pain, decreased urine volume, difficulty urinating and dyspareunia.  Musculoskeletal: Negative for back pain, joint swelling, arthralgias and gait problem. Neurological: Negative for dizziness, tremors, seizures, syncope, facial asymmetry, speech difficulty, weakness, light-headedness, numbness and headaches.     Objective:   Filed Vitals:   10/02/15 0948  BP: 115/69  Pulse: 69  Temp: 98 F (36.7 C)  Resp: 16    Physical Exam: General: No acute respiratory distress Lungs: Clear to auscultation bilaterally without wheezes or crackles Cardiovascular: Regular rate and rhythm without murmur gallop or rub normal S1 and S2 Abdomen: Nontender, nondistended, soft, bowel sounds positive, no rebound, no ascites, no appreciable mass Extremities: No significant cyanosis, clubbing, or edema bilateral lower extremities  Lab Results  Component Value Date   WBC 3.4* 03/01/2015   HGB 13.6 03/01/2015   HCT 40.9 03/01/2015   MCV 80.8 03/01/2015   PLT 232 03/01/2015  Lab Results  Component Value Date   CREATININE 0.82 03/01/2015   BUN 11 03/01/2015   NA 142 03/01/2015   K 4.4 03/01/2015   CL 104 03/01/2015   CO2 26 03/01/2015    Lab Results  Component Value Date   HGBA1C 5.8* 03/01/2015   Lipid Panel     Component Value Date/Time   CHOL 210* 03/01/2015 1203   TRIG 146 03/01/2015 1203   HDL 50 03/01/2015  1203   CHOLHDL 4.2 03/01/2015 1203   VLDL 29 03/01/2015 1203   LDLCALC 131* 03/01/2015 1203       Assessment and plan:   Brittany Willis was seen today for follow-up.  Diagnoses and all orders for this visit:  HLD (hyperlipidemia) -     atorvastatin (LIPITOR) 10 MG tablet; Take 1 tablet (10 mg total) by mouth daily at 6 PM. For cholesterol -     Lipid panel; Future  Patient has eaten breakfast this morning and will return this week for lipid panel. Lifestyle modifications and medication adherence reviewed.    Health Maintenance: Colonoscopy-patient states she would like to wait till next year after obtaining Medicare.       Return for this week Lab Visit and 6 mo PCP.   Melissa Noon, Student NP [facility] [phone] 10/02/2015, 10:11 AM

## 2015-10-03 ENCOUNTER — Ambulatory Visit: Payer: Self-pay | Attending: Internal Medicine

## 2015-10-03 DIAGNOSIS — E785 Hyperlipidemia, unspecified: Secondary | ICD-10-CM | POA: Insufficient documentation

## 2015-10-03 LAB — LIPID PANEL
CHOLESTEROL: 165 mg/dL (ref 125–200)
HDL: 54 mg/dL (ref >=46)
LDL Cholesterol: 85 mg/dL (ref <130)
TRIGLYCERIDES: 128 mg/dL (ref <150)
Total CHOL/HDL Ratio: 3.1 Ratio (ref <=5.0)
VLDL: 26 mg/dL (ref <30)

## 2015-10-05 ENCOUNTER — Telehealth: Payer: Self-pay

## 2015-10-05 NOTE — Telephone Encounter (Signed)
-----   Message from Lance Bosch, NP sent at 10/05/2015 11:27 AM EST ----- Cholesterol looks much better

## 2015-10-05 NOTE — Telephone Encounter (Signed)
Spoke with patient and she is aware of her lab results

## 2015-12-06 ENCOUNTER — Ambulatory Visit: Payer: Self-pay | Attending: Internal Medicine

## 2017-05-05 ENCOUNTER — Other Ambulatory Visit: Payer: Self-pay | Admitting: Internal Medicine

## 2017-05-05 DIAGNOSIS — Z1231 Encounter for screening mammogram for malignant neoplasm of breast: Secondary | ICD-10-CM

## 2017-05-23 ENCOUNTER — Ambulatory Visit
Admission: RE | Admit: 2017-05-23 | Discharge: 2017-05-23 | Disposition: A | Discharge status: Home / Self Care | Payer: Medicare HMO | Source: Ambulatory Visit | Attending: Internal Medicine | Admitting: Internal Medicine

## 2017-05-23 DIAGNOSIS — Z1231 Encounter for screening mammogram for malignant neoplasm of breast: Secondary | ICD-10-CM

## 2017-05-26 ENCOUNTER — Encounter: Payer: Self-pay | Admitting: Gastroenterology

## 2017-07-18 ENCOUNTER — Other Ambulatory Visit: Payer: Self-pay

## 2017-07-18 ENCOUNTER — Ambulatory Visit (AMBULATORY_SURGERY_CENTER): Payer: Self-pay | Admitting: *Deleted

## 2017-07-18 VITALS — Ht 65.0 in | Wt 179.0 lb

## 2017-07-18 DIAGNOSIS — Z800 Family history of malignant neoplasm of digestive organs: Secondary | ICD-10-CM

## 2017-07-18 MED ORDER — PEG-KCL-NACL-NASULF-NA ASC-C 140 G PO SOLR: 1.0000 | ORAL | 0 refills | Status: DC

## 2017-07-18 NOTE — Progress Notes (Signed)
No egg or soy allergy known to patient  No issues with past sedation with any surgeries  or procedures, no intubation problems  No diet pills per patient No home 02 use per patient  No blood thinners per patient  Pt states  issues with constipation - uses prn miralax and MOM -- if drinks apple juice or eats prunes has BM soft daily  No A fib or A flutter  EMMI video sent to pt's e mail - pt has no e mail  plenvu sample- lot [id number] exp 12/2018 as directed

## 2017-08-01 ENCOUNTER — Encounter: Payer: Self-pay | Admitting: Gastroenterology

## 2017-08-01 ENCOUNTER — Other Ambulatory Visit: Payer: Self-pay

## 2017-08-01 ENCOUNTER — Ambulatory Visit (AMBULATORY_SURGERY_CENTER): Payer: Medicare HMO | Admitting: Gastroenterology

## 2017-08-01 VITALS — BP 133/86 | HR 53 | Temp 98.9°F | Resp 11 | Ht 65.0 in | Wt 179.0 lb

## 2017-08-01 DIAGNOSIS — D123 Benign neoplasm of transverse colon: Secondary | ICD-10-CM

## 2017-08-01 DIAGNOSIS — Z800 Family history of malignant neoplasm of digestive organs: Secondary | ICD-10-CM | POA: Diagnosis present

## 2017-08-01 DIAGNOSIS — D122 Benign neoplasm of ascending colon: Secondary | ICD-10-CM

## 2017-08-01 DIAGNOSIS — Z1212 Encounter for screening for malignant neoplasm of rectum: Secondary | ICD-10-CM

## 2017-08-01 DIAGNOSIS — Z1211 Encounter for screening for malignant neoplasm of colon: Secondary | ICD-10-CM

## 2017-08-01 MED ORDER — SODIUM CHLORIDE 0.9 % IV SOLN: 500.0000 mL | Freq: Once | INTRAVENOUS | Status: AC

## 2017-08-01 NOTE — Op Note (Signed)
[place] Patient Name: Brittany Willis Procedure Date: 08/01/2017 10:12 AM MRN: [id number] Endoscopist: Remo Lipps P. Armbruster MD, MD Age: 67 Referring MD:  Date of Birth: Nov 12, 1950 Gender: Female Account #: [id number] Procedure:                Colonoscopy Indications:              Screening in patient at increased risk: Family                            history of 1st-degree relative with colorectal                            cancer - mother diagnosed around age 58 (she is                            uncertain of exact age of diagnosis), This is the                            patient's first colonoscopy Medicines:                Monitored Anesthesia Care Procedure:                Pre-Anesthesia Assessment:                           - Prior to the procedure, a History and Physical                            was performed, and patient medications and                            allergies were reviewed. The patient's tolerance of                            previous anesthesia was also reviewed. The risks                            and benefits of the procedure and the sedation                            options and risks were discussed with the patient.                            All questions were answered, and informed consent                            was obtained. Prior Anticoagulants: The patient has                            taken no previous anticoagulant or antiplatelet                            agents. ASA Grade Assessment: II - A patient with  mild systemic disease. After reviewing the risks                            and benefits, the patient was deemed in                            satisfactory condition to undergo the procedure.                           After obtaining informed consent, the colonoscope                            was passed under direct vision. Throughout the                            procedure, the patient's blood  pressure, pulse, and                            oxygen saturations were monitored continuously. The                            Colonoscope was introduced through the anus and                            advanced to the the terminal ileum, with                            identification of the appendiceal orifice and IC                            valve. The colonoscopy was performed without                            difficulty. The patient tolerated the procedure                            well. The quality of the bowel preparation was                            good. The terminal ileum, ileocecal valve,                            appendiceal orifice, and rectum were photographed. Scope In: 10:14:02 AM Scope Out: 10:32:06 AM Scope Withdrawal Time: 0 hours 14 minutes 54 seconds  Total Procedure Duration: 0 hours 18 minutes 4 seconds  Findings:                 The perianal and digital rectal examinations were                            normal.                           The terminal ileum appeared normal.  A 5 mm polyp was found in the ascending colon. The                            polyp was sessile. The polyp was removed with a                            cold snare. Resection and retrieval were complete.                           A 5 mm polyp was found in the transverse colon. The                            polyp was sessile. The polyp was removed with a                            cold snare. Resection and retrieval were complete.                           Many medium-mouthed diverticula were found in the                            sigmoid colon and descending colon.                           Internal hemorrhoids were found during retroflexion.                           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                           The exam was otherwise without abnormality. Complications:            No immediate complications. Estimated blood loss:                             Minimal. Estimated Blood Loss:     Estimated blood loss was minimal. Impression:               - The examined portion of the ileum was normal.                           - One 5 mm polyp in the ascending colon, removed                            with a cold snare. Resected and retrieved.                           - One 5 mm polyp in the transverse colon, removed                            with a cold snare. Resected and retrieved.                           -  Diverticulosis in the sigmoid colon and in the                            descending colon.                           - Internal hemorrhoids.                           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                           - The examination was otherwise normal. Recommendation:           - Patient has a contact number available for                            emergencies. The signs and symptoms of potential                            delayed complications were discussed with the                            patient. Return to normal activities tomorrow.                            Written discharge instructions were provided to the                            patient.                           - Resume previous diet.                           - Continue present medications.                           - Await pathology results.                           - Repeat colonoscopy is recommended for                            surveillance in 5 years                           - No ibuprofen, naproxen, or other non-steroidal                            anti-inflammatory drugs for 2 weeks after polyp                            removal. Remo Lipps P. Armbruster MD, MD 08/01/2017 10:38:18 AM This report has been signed electronically.

## 2017-08-01 NOTE — Progress Notes (Signed)
A and O x3. Report to RN. Tolerated MAC anesthesia well.

## 2017-08-01 NOTE — Progress Notes (Signed)
Called to room to assist during endoscopic procedure.  Patient ID and intended procedure confirmed with present staff. Received instructions for my participation in the procedure from the performing physician.  

## 2017-08-01 NOTE — Patient Instructions (Signed)
Patient information on polyps and hemorrhoids and diverticulosis given.   YOU HAD AN ENDOSCOPIC PROCEDURE TODAY AT [facility]:   Refer to the procedure report that was given to you for any specific questions about what was found during the examination.  If the procedure report does not answer your questions, please call your gastroenterologist to clarify.  If you requested that your care partner not be given the details of your procedure findings, then the procedure report has been included in a sealed envelope for you to review at your convenience later.     YOU SHOULD EXPECT: Some feelings of bloating in the abdomen. Passage of more gas than usual.  Walking can help get rid of the air that was put into your GI tract during the procedure and reduce the bloating. If you had a lower endoscopy (such as a colonoscopy or flexible sigmoidoscopy) you may notice spotting of blood in your stool or on the toilet paper. If you underwent a bowel prep for your procedure, you may not have a normal bowel movement for a few days.  Please Note:  You might notice some irritation and congestion in your nose or some drainage.  This is from the oxygen used during your procedure.  There is no need for concern and it should clear up in a day or so.  SYMPTOMS TO REPORT IMMEDIATELY:   Following lower endoscopy (colonoscopy or flexible sigmoidoscopy):  Excessive amounts of blood in the stool  Significant tenderness or worsening of abdominal pains  Swelling of the abdomen that is new, acute  Fever of 100F or higher For urgent or emergent issues, a gastroenterologist can be reached at any hour by calling [phone].   DIET:  We do recommend a small meal at first, but then you may proceed to your regular diet.  Drink plenty of fluids but you should avoid alcoholic beverages for 24 hours.  ACTIVITY:  You should plan to take it easy for the rest of today and you should NOT DRIVE or use heavy  machinery until tomorrow (because of the sedation medicines used during the test).    FOLLOW UP: Our staff will call the number listed on your records the next business day following your procedure to check on you and address any questions or concerns that you may have regarding the information given to you following your procedure. If we do not reach you, we will leave a message.  However, if you are feeling well and you are not experiencing any problems, there is no need to return our call.  We will assume that you have returned to your regular daily activities without incident.  If any biopsies were taken you will be contacted by phone or by letter within the next 1-3 weeks.  Please call us at [phone] if you have not heard about the biopsies in 3 weeks.    SIGNATURES/CONFIDENTIALITY: You and/or your care partner have signed paperwork which will be entered into your electronic medical record.  These signatures attest to the fact that that the information above on your After Visit Summary has been reviewed and is understood.  Full responsibility of the confidentiality of this discharge information lies with you and/or your care-partner.

## 2017-08-04 ENCOUNTER — Telehealth: Payer: Self-pay | Admitting: *Deleted

## 2017-08-04 ENCOUNTER — Telehealth: Payer: Self-pay

## 2017-08-04 NOTE — Telephone Encounter (Signed)
  Follow up Call-  Call back number 08/01/2017  Post procedure Call Back phone  # [phone]  Permission to leave phone message Yes  Some recent data might be hidden     Patient questions:  Left message to call us if necessary.

## 2017-08-04 NOTE — Telephone Encounter (Signed)
  Follow up Call-  Call back number 08/01/2017  Post procedure Call Back phone  # [phone]  Permission to leave phone message Yes  Some recent data might be hidden     Patient questions:  Do you have a fever, pain , or abdominal swelling? No. Pain Score  0 *  Have you tolerated food without any problems? Yes.    Have you been able to return to your normal activities? Yes.    Do you have any questions about your discharge instructions: Diet   No. Medications  No. Follow up visit  No.  Do you have questions or concerns about your Care? No.  Actions: * If pain score is 4 or above: No action needed, pain <4.

## 2017-08-08 ENCOUNTER — Encounter: Payer: Self-pay | Admitting: Gastroenterology

## 2018-06-11 ENCOUNTER — Other Ambulatory Visit (HOSPITAL_COMMUNITY)
Admission: RE | Admit: 2018-06-11 | Discharge: 2018-06-11 | Disposition: A | Discharge status: Home / Self Care | Payer: Medicare HMO | Source: Ambulatory Visit | Attending: Obstetrics and Gynecology | Admitting: Obstetrics and Gynecology

## 2018-06-11 ENCOUNTER — Ambulatory Visit: Payer: Medicare HMO | Admitting: Obstetrics and Gynecology

## 2018-06-11 ENCOUNTER — Encounter: Payer: Self-pay | Admitting: Obstetrics and Gynecology

## 2018-06-11 VITALS — BP 121/77 | HR 63 | Resp 16 | Ht 63.0 in | Wt 170.0 lb

## 2018-06-11 DIAGNOSIS — Z23 Encounter for immunization: Secondary | ICD-10-CM | POA: Diagnosis not present

## 2018-06-11 DIAGNOSIS — Z01419 Encounter for gynecological examination (general) (routine) without abnormal findings: Secondary | ICD-10-CM | POA: Insufficient documentation

## 2018-06-11 DIAGNOSIS — N8110 Cystocele, unspecified: Secondary | ICD-10-CM

## 2018-06-11 DIAGNOSIS — N950 Postmenopausal bleeding: Secondary | ICD-10-CM

## 2018-06-11 NOTE — Progress Notes (Signed)
GYNECOLOGY ANNUAL PREVENTATIVE CARE ENCOUNTER NOTE  Subjective:   Brittany Willis is a 67 y.o. G80P1001 female here for a annual gynecologic exam. Current complaints: none. Had a yeast infection last month, self treated and has no current issues. Reports she has occasional spotting when she gets stressed out that has been going on for about a year.   Has pessary that she uses as needed (when she is doing lifting). Wears it five days a week and tries to take it out at night. Originally got it to lift her bladder up. She has not issues putting it in and taking it out herself.    Gynecologic History No LMP recorded. Patient is postmenopausal. Contraception: post menopausal status Last Pap: reports 3 years ago, no records. Results were: normal per patient (reports remote history of abnormal pap) Last mammogram: 04/2017. Results were: Birads 1  Obstetric History OB History  Gravida Para Term Preterm AB Living  1 1 1  0 0 1  SAB TAB Ectopic Multiple Live Births  0 0 0 0      # Outcome Date GA Lbr Len/2nd Weight Sex Delivery Anes PTL Lv  1 Term      Vag-Spont       Past Medical History:  Diagnosis Date  . Allergy   . Anemia    as teenager   . Constipation    on and off- uses prn MOM or miralax  . Cystocele with prolapse   . Rectocele    Past Surgical History:  Procedure Laterality Date  . EYE SURGERY     repair hole behind optic nerve     Current Outpatient Medications on File Prior to Visit  Medication Sig Dispense Refill  . cetirizine (ZYRTEC) 10 MG tablet Take 10 mg by mouth daily.    . Cholecalciferol (VITAMIN D PO) Take 5,000 Units by mouth daily.    . Flaxseed, Linseed, (FLAX SEED OIL) 1000 MG CAPS Take 1,000 mg by mouth daily.    . Lactobacillus (ACIDOPHILUS) 100 MG CAPS Take by mouth.    . loteprednol (LOTEMAX) 0.2 % SUSP 1 drop 4 (four) times daily.    . magnesium hydroxide (MILK OF MAGNESIA) 400 MG/5ML suspension Take by mouth daily as needed for mild constipation.     . Melatonin 5 MG CAPS Take 5 mg by mouth at bedtime.    . Multiple Vitamin (MULTIVITAMIN) capsule Take 1 capsule by mouth daily.    . Olopatadine HCl (PAZEO) 0.7 % SOLN Apply to eye.    . polyethylene glycol powder (GLYCOLAX/MIRALAX) powder TAKE 17 G BY MOUTH DAILY. 850 g 3  . Rhubarb (ESTROVEN MENOPAUSE RELIEF PO) Take by mouth.    . vitamin C (ASCORBIC ACID) 500 MG tablet Take 500 mg by mouth daily.     Current Facility-Administered Medications on File Prior to Visit  Medication Dose Route Frequency Provider Last Rate Last Dose  . 0.9 %  sodium chloride infusion  500 mL Intravenous Once Armbruster, Carlota Raspberry, MD        Allergies  Allergen Reactions  . Aspirin Swelling    Social History   Socioeconomic History  . Marital status: Married    Spouse name: Not on file  . Number of children: Not on file  . Years of education: Not on file  . Highest education level: Not on file  Occupational History  . Not on file  Social Needs  . Financial resource strain: Not on file  . Food insecurity:  Worry: Not on file    Inability: Not on file  . Transportation needs:    Medical: Not on file    Non-medical: Not on file  Tobacco Use  . Smoking status: Never Smoker  . Smokeless tobacco: Never Used  Substance and Sexual Activity  . Alcohol use: No  . Drug use: No  . Sexual activity: Not Currently    Birth control/protection: Surgical  Lifestyle  . Physical activity:    Days per week: Not on file    Minutes per session: Not on file  . Stress: Not on file  Relationships  . Social connections:    Talks on phone: Not on file    Gets together: Not on file    Attends religious service: Not on file    Active member of club or organization: Not on file    Attends meetings of clubs or organizations: Not on file    Relationship status: Not on file  . Intimate partner violence:    Fear of current or ex partner: Not on file    Emotionally abused: Not on file    Physically abused: Not  on file    Forced sexual activity: Not on file  Other Topics Concern  . Not on file  Social History Narrative  . Not on file   Family History  Problem Relation Age of Onset  . Cancer Mother   . Colon cancer Mother   . Colon polyps Neg Hx    The following portions of the patient's history were reviewed and updated as appropriate: allergies, current medications, past family history, past medical history, past social history, past surgical history and problem list.  Review of Systems Pertinent items are noted in HPI.   Objective:  BP 121/77   Pulse 63   Resp 16   Ht 5\' 3"  (1.6 m)   Wt 170 lb (77.1 kg)   BMI 30.11 kg/m  CONSTITUTIONAL: Well-developed, well-nourished female in no acute distress.  HENT:  Normocephalic, atraumatic, External right and left ear normal. Oropharynx is clear and moist EYES: Conjunctivae and EOM are normal. Pupils are equal, round, and reactive to light. No scleral icterus.  NECK: Normal range of motion, supple, no masses.  Normal thyroid.  SKIN: Skin is warm and dry. No rash noted. Not diaphoretic. No erythema. No pallor. NEUROLOGIC: Alert and oriented to person, place, and time. Normal reflexes, muscle tone coordination. No cranial nerve deficit noted. PSYCHIATRIC: Normal mood and affect. Normal behavior. Normal judgment and thought content. CARDIOVASCULAR: Normal heart rate noted, regular rhythm RESPIRATORY: Clear to auscultation bilaterally. Effort and breath sounds normal, no problems with respiration noted. BREASTS: Symmetric in size. No masses, skin changes, nipple drainage, or lymphadenopathy. ABDOMEN: Soft, normal bowel sounds, no distention noted.  No tenderness, rebound or guarding.  PELVIC: Normal appearing external genitalia; normal appearing vaginal mucosa and cervix, cervix appears flush with apex, significant cystocele noted on exam, No abnormal discharge noted.  Pap smear obtained.  Normal uterine size, no other palpable masses, no uterine or  adnexal tenderness. MUSCULOSKELETAL: Normal range of motion. No tenderness.  No cyanosis, clubbing, or edema.  2+ distal pulses.   Assessment and Plan:   1. Well woman exam - normal exam with cystocele - Cytology - PAP( [facility]) - MM 3D SCREEN BREAST BILATERAL; Future  2. Postmenopausal bleeding - Reviewed that occasional spotting is likely related to pessary use and irritation, however none noted on exam today - patient reports spotting is very occasional, however  would recommend [place] and EMB if endometrial stripe is thickened, she is agreeable to this plan - US PELVIC COMPLETE WITH TRANSVAGINAL; Future  3. Female bladder prolapse Normal exam, cont pessary use Reviewed importance of removal frequently to keep vaginal tissue healthy, patient verbalizes understanding, denies any issues with insertion and removal herself  Will follow up results of pap smear and manage accordingly. Encouraged improvement in diet and exercise.  Mammogram ordered today Colonoscopy UTD Flu vaccine today  Routine preventative health maintenance measures emphasized. Please refer to After Visit Summary for other counseling recommendations.     Feliz Beam, M.D. [facility]

## 2018-06-12 LAB — CYTOLOGY - PAP
Diagnosis: NEGATIVE
HPV: NOT DETECTED

## 2018-06-18 ENCOUNTER — Ambulatory Visit (HOSPITAL_COMMUNITY): Payer: Medicare HMO

## 2018-06-22 ENCOUNTER — Ambulatory Visit (HOSPITAL_COMMUNITY)
Admission: RE | Admit: 2018-06-22 | Discharge: 2018-06-22 | Disposition: A | Discharge status: Home / Self Care | Payer: Medicare HMO | Source: Ambulatory Visit | Attending: Obstetrics and Gynecology | Admitting: Obstetrics and Gynecology

## 2018-06-22 DIAGNOSIS — N950 Postmenopausal bleeding: Secondary | ICD-10-CM | POA: Insufficient documentation

## 2018-07-28 ENCOUNTER — Ambulatory Visit
Admission: RE | Admit: 2018-07-28 | Discharge: 2018-07-28 | Disposition: A | Discharge status: Home / Self Care | Payer: Medicare HMO | Source: Ambulatory Visit | Attending: Obstetrics and Gynecology | Admitting: Obstetrics and Gynecology

## 2018-07-28 DIAGNOSIS — Z01419 Encounter for gynecological examination (general) (routine) without abnormal findings: Secondary | ICD-10-CM

## 2019-03-29 ENCOUNTER — Other Ambulatory Visit: Payer: Self-pay

## 2019-03-29 DIAGNOSIS — Z20822 Contact with and (suspected) exposure to covid-19: Secondary | ICD-10-CM

## 2019-03-30 LAB — NOVEL CORONAVIRUS, NAA: SARS-CoV-2, NAA: NOT DETECTED

## 2019-08-23 ENCOUNTER — Other Ambulatory Visit: Payer: Self-pay | Admitting: Internal Medicine

## 2019-08-23 DIAGNOSIS — Z1231 Encounter for screening mammogram for malignant neoplasm of breast: Secondary | ICD-10-CM

## 2019-10-01 ENCOUNTER — Ambulatory Visit
Admission: RE | Admit: 2019-10-01 | Discharge: 2019-10-01 | Disposition: A | Discharge status: Home / Self Care | Payer: Medicare Other | Source: Ambulatory Visit | Attending: Internal Medicine | Admitting: Internal Medicine

## 2019-10-01 ENCOUNTER — Other Ambulatory Visit: Payer: Self-pay

## 2019-10-01 DIAGNOSIS — Z1231 Encounter for screening mammogram for malignant neoplasm of breast: Secondary | ICD-10-CM

## 2020-04-12 ENCOUNTER — Encounter: Payer: Self-pay | Admitting: Obstetrics and Gynecology

## 2020-04-12 ENCOUNTER — Ambulatory Visit (INDEPENDENT_AMBULATORY_CARE_PROVIDER_SITE_OTHER): Payer: Medicare Other | Admitting: Obstetrics and Gynecology

## 2020-04-12 ENCOUNTER — Other Ambulatory Visit: Payer: Self-pay

## 2020-04-12 VITALS — BP 148/79 | HR 65 | Ht 64.0 in | Wt 172.0 lb

## 2020-04-12 DIAGNOSIS — Z124 Encounter for screening for malignant neoplasm of cervix: Secondary | ICD-10-CM

## 2020-04-12 DIAGNOSIS — Z01419 Encounter for gynecological examination (general) (routine) without abnormal findings: Secondary | ICD-10-CM | POA: Diagnosis not present

## 2020-04-12 DIAGNOSIS — Z9189 Other specified personal risk factors, not elsewhere classified: Secondary | ICD-10-CM

## 2020-04-12 DIAGNOSIS — N8110 Cystocele, unspecified: Secondary | ICD-10-CM | POA: Diagnosis not present

## 2020-04-12 DIAGNOSIS — Z780 Asymptomatic menopausal state: Secondary | ICD-10-CM

## 2020-04-12 MED ORDER — RING PESSARY/SUPPORT MISC: 1.0000 [IU] | 0 refills | Status: AC | PRN

## 2020-04-12 NOTE — Progress Notes (Signed)
GYNECOLOGY ANNUAL PREVENTATIVE CARE ENCOUNTER NOTE  Subjective:   Brittany Willis is a 69 y.o. G77P1001 female here for a annual gynecologic exam. Current complaints: none. Wants to make sure she does not need pap.   Continues to use pessary as needed. Feels she has adequate support when she wears a girdle. Uses pessary as needed, has no issues taking it out/putting it in herself. No bleeding, occasional discharge when it comes out but no odor/itching. Pessary slightly torn and wondering if it is okay to use    Denies other complaints, overall doing well.    Gynecologic History No LMP recorded. Patient is postmenopausal. Contraception: post menopausal status Last Pap: 2019. Results: normal Last mammogram: 2021. Results: Birads 1 DEXA: has never had  Obstetric History OB History  Gravida Para Term Preterm AB Living  1 1 1  0 0 1  SAB TAB Ectopic Multiple Live Births  0 0 0 0      # Outcome Date GA Lbr Len/2nd Weight Sex Delivery Anes PTL Lv  1 Term      Vag-Spont       Past Medical History:  Diagnosis Date  . Allergy   . Anemia    as teenager   . Constipation    on and off- uses prn MOM or miralax  . Cystocele with prolapse   . Rectocele     Past Surgical History:  Procedure Laterality Date  . EYE SURGERY     repair hole behind optic nerve     Current Outpatient Medications on File Prior to Visit  Medication Sig Dispense Refill  . cetirizine (ZYRTEC) 10 MG tablet Take 10 mg by mouth daily.    . Cholecalciferol (VITAMIN D PO) Take 5,000 Units by mouth daily.    . Flaxseed, Linseed, (FLAX SEED OIL) 1000 MG CAPS Take 1,000 mg by mouth daily.    . Lactobacillus (ACIDOPHILUS) 100 MG CAPS Take by mouth.    . loteprednol (LOTEMAX) 0.2 % SUSP 1 drop 4 (four) times daily.    . magnesium hydroxide (MILK OF MAGNESIA) 400 MG/5ML suspension Take by mouth daily as needed for mild constipation.    . Melatonin 5 MG CAPS Take 5 mg by mouth at bedtime.    . Multiple Vitamin  (MULTIVITAMIN) capsule Take 1 capsule by mouth daily.    . Olopatadine HCl (PAZEO) 0.7 % SOLN Apply to eye.    . polyethylene glycol powder (GLYCOLAX/MIRALAX) powder TAKE 17 G BY MOUTH DAILY. 850 g 3  . Rhubarb (ESTROVEN MENOPAUSE RELIEF PO) Take by mouth.    . vitamin C (ASCORBIC ACID) 500 MG tablet Take 500 mg by mouth daily.     Current Facility-Administered Medications on File Prior to Visit  Medication Dose Route Frequency Provider Last Rate Last Admin  . 0.9 %  sodium chloride infusion  500 mL Intravenous Once Armbruster, Carlota Raspberry, MD        Allergies  Allergen Reactions  . Aspirin Swelling    Social History   Socioeconomic History  . Marital status: Married    Spouse name: Not on file  . Number of children: Not on file  . Years of education: Not on file  . Highest education level: Not on file  Occupational History  . Not on file  Tobacco Use  . Smoking status: Never Smoker  . Smokeless tobacco: Never Used  Substance and Sexual Activity  . Alcohol use: No  . Drug use: No  . Sexual activity: Not  Currently    Birth control/protection: Surgical  Other Topics Concern  . Not on file  Social History Narrative  . Not on file   Social Determinants of Health   Financial Resource Strain:   . Difficulty of Paying Living Expenses: Not on file  Food Insecurity:   . Worried About Charity fundraiser in the Last Year: Not on file  . Ran Out of Food in the Last Year: Not on file  Transportation Needs:   . Lack of Transportation (Medical): Not on file  . Lack of Transportation (Non-Medical): Not on file  Physical Activity:   . Days of Exercise per Week: Not on file  . Minutes of Exercise per Session: Not on file  Stress:   . Feeling of Stress : Not on file  Social Connections:   . Frequency of Communication with Friends and Family: Not on file  . Frequency of Social Gatherings with Friends and Family: Not on file  . Attends Religious Services: Not on file  . Active  Member of Clubs or Organizations: Not on file  . Attends Archivist Meetings: Not on file  . Marital Status: Not on file  Intimate Partner Violence:   . Fear of Current or Ex-Partner: Not on file  . Emotionally Abused: Not on file  . Physically Abused: Not on file  . Sexually Abused: Not on file    Family History  Problem Relation Age of Onset  . Cancer Mother   . Colon cancer Mother   . Colon polyps Neg Hx     The following portions of the patient's history were reviewed and updated as appropriate: allergies, current medications, past family history, past medical history, past social history, past surgical history and problem list.  Review of Systems Pertinent items are noted in HPI.   Objective:  BP (!) 148/79   Pulse 65   Ht 5\' 4"  (1.626 m)   Wt 172 lb (78 kg)   BMI 29.52 kg/m  CONSTITUTIONAL: Well-developed, well-nourished female in no acute distress.  HENT:  Normocephalic, atraumatic, External right and left ear normal. Oropharynx is clear and moist EYES: Conjunctivae and EOM are normal. Pupils are equal, round, and reactive to light. No scleral icterus.  NECK: Normal range of motion, supple, no masses.  Normal thyroid.  SKIN: Skin is warm and dry. No rash noted. Not diaphoretic. No erythema. No pallor. NEUROLOGIC: Alert and oriented to person, place, and time. Normal reflexes, muscle tone coordination. No cranial nerve deficit noted. PSYCHIATRIC: Normal mood and affect. Normal behavior. Normal judgment and thought content. CARDIOVASCULAR: Normal heart rate noted RESPIRATORY: Effort normal, no problems with respiration noted. BREASTS: Symmetric in size. No masses, skin changes, nipple drainage, or lymphadenopathy. ABDOMEN: Soft, no distention noted.  No tenderness, rebound or guarding.  PELVIC: Normal appearing external genitalia; normal appearing vaginal mucosa and cervix.  No abnormal discharge noted. Cystocele present, healthy vaginal mucosa, no ulcerations  noted MUSCULOSKELETAL: Normal range of motion. No tenderness.  No cyanosis, clubbing, or edema.  2+ distal pulses.  Exam done with chaperone present.  Assessment and Plan:   1. Well woman exam Healthy female exam  2. Cervical cancer screening Reviewed indications for ongoing pap, pt reports regular pap for her adult life, had one very remote abnormal pap but that last 20 years of pap have been normal, reviewed risk of cervical cancer is low if discontinues screening, but as she has had normal x 20 years, she is comfortable with discontinuing screening  at this point  3. At risk for decreased bone density - DG Bone Density; Future  4. Female cystocele Cont pessary use Annual yearly Ordered new pessary today as hers is torn in support Ring pessary with support #5  5. Post-menopause DEXA ordered   Encouraged improvement in diet and exercise.  COVID vaccine UTD Mammogram UTD Referral for colonoscopy UTD DEXA due based on age  Routine preventative health maintenance measures emphasized. Please refer to After Visit Summary for other counseling recommendations.   Total face-to-face time with patient: 25 minutes. Over 50% of encounter was spent on counseling and coordination of care.   Feliz Beam, M.D. Attending [facility] Fish farm manager)

## 2020-04-12 NOTE — Progress Notes (Signed)
69 y.o. GYN presents for AEX. She stated that she does not wear the Pressary often, only when she feels the prolapse.   Last PAP 06/11/2018 NIL

## 2020-10-09 ENCOUNTER — Other Ambulatory Visit: Payer: Self-pay | Admitting: Internal Medicine

## 2020-10-09 DIAGNOSIS — Z1231 Encounter for screening mammogram for malignant neoplasm of breast: Secondary | ICD-10-CM

## 2020-11-14 ENCOUNTER — Encounter: Payer: Self-pay | Admitting: Gastroenterology

## 2020-11-30 ENCOUNTER — Ambulatory Visit
Admission: RE | Admit: 2020-11-30 | Discharge: 2020-11-30 | Disposition: A | Discharge status: Home / Self Care | Payer: Medicare Other | Source: Ambulatory Visit | Attending: Internal Medicine | Admitting: Internal Medicine

## 2020-11-30 ENCOUNTER — Other Ambulatory Visit: Payer: Self-pay

## 2020-11-30 DIAGNOSIS — Z1231 Encounter for screening mammogram for malignant neoplasm of breast: Secondary | ICD-10-CM

## 2021-11-19 ENCOUNTER — Emergency Department (HOSPITAL_COMMUNITY)
Admission: EM | Admit: 2021-11-19 | Discharge: 2021-11-19 | Disposition: A | Discharge status: Home / Self Care | Payer: Medicare Other | Attending: Emergency Medicine | Admitting: Emergency Medicine

## 2021-11-19 ENCOUNTER — Other Ambulatory Visit: Payer: Self-pay

## 2021-11-19 ENCOUNTER — Emergency Department (HOSPITAL_COMMUNITY): Payer: Medicare Other

## 2021-11-19 DIAGNOSIS — Y92410 Unspecified street and highway as the place of occurrence of the external cause: Secondary | ICD-10-CM | POA: Insufficient documentation

## 2021-11-19 DIAGNOSIS — M25562 Pain in left knee: Secondary | ICD-10-CM | POA: Insufficient documentation

## 2021-11-19 DIAGNOSIS — M25512 Pain in left shoulder: Secondary | ICD-10-CM | POA: Insufficient documentation

## 2021-11-19 MED ORDER — ACETAMINOPHEN 325 MG PO TABS
650.0000 mg | ORAL_TABLET | Freq: Once | ORAL | Status: AC
  Administered 2021-11-19: 650 mg via ORAL
  Filled 2021-11-19: qty 2
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
## 2021-11-19 NOTE — ED Notes (Signed)
Pt ambulated in hallway with RN. Paper scrubs given to pt.  ?
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
## 2021-11-19 NOTE — Discharge Instructions (Signed)
Call your primary care doctor or specialist as discussed in the next 2-3 days.   Return immediately back to the ER if:  Your symptoms worsen within the next 12-24 hours. You develop new symptoms such as new fevers, persistent vomiting, new pain, shortness of breath, or new weakness or numbness, or if you have any other concerns.  

## 2021-12-06 ENCOUNTER — Other Ambulatory Visit: Payer: Self-pay | Admitting: Student

## 2021-12-06 ENCOUNTER — Ambulatory Visit (INDEPENDENT_AMBULATORY_CARE_PROVIDER_SITE_OTHER): Payer: Medicare Other | Admitting: Student

## 2021-12-06 ENCOUNTER — Other Ambulatory Visit: Payer: Self-pay | Admitting: Obstetrics and Gynecology

## 2021-12-06 ENCOUNTER — Encounter: Payer: Self-pay | Admitting: Student

## 2021-12-06 VITALS — BP 129/77 | HR 65 | Wt 164.0 lb

## 2021-12-06 DIAGNOSIS — Z0000 Encounter for general adult medical examination without abnormal findings: Secondary | ICD-10-CM | POA: Diagnosis not present

## 2021-12-06 DIAGNOSIS — M810 Age-related osteoporosis without current pathological fracture: Secondary | ICD-10-CM

## 2021-12-06 DIAGNOSIS — N8110 Cystocele, unspecified: Secondary | ICD-10-CM | POA: Diagnosis not present

## 2021-12-06 DIAGNOSIS — Z01419 Encounter for gynecological examination (general) (routine) without abnormal findings: Secondary | ICD-10-CM

## 2021-12-06 NOTE — Progress Notes (Signed)
GYN pt in office for annual exam. Pt states she need new ring pessary for prolapse. Pt c/o of pain in the right hip/buttocks when sitting too long. No other complaints at this.  ?

## 2021-12-06 NOTE — Addendum Note (Signed)
Addended by: Carolynne Edouard on: 12/06/2021 04:43 PM ? ? Modules accepted: Orders ? ?

## 2021-12-06 NOTE — Progress Notes (Addendum)
?  History:  ?[name] is a 71 y.o. G1P1001 who presents to clinic today for gyn exam. She reports that her pessary is torn and that she has been using the same pessary for "20 years". She reports that she walks frequently and that she uses her pessary all the time to prevent bladder leakage. She denies bleeding or discomfort using pessary. She reports that she is otherwise in good health although has not been to see her PCP in a while.  ?She has no other complaints other than she wants her pessary prescription refilled as when she went in 2021 to pick up her pessary it was "not at [facility]".  ? ?The following portions of the patient's history were reviewed and updated as appropriate: allergies, current medications, family history, past medical history, social history, past surgical history and problem list. ? ?Review of Systems:  ?Review of Systems  ?Constitutional: Negative.   ?HENT: Negative.    ?Respiratory: Negative.    ?Cardiovascular: Negative.   ?Genitourinary: Negative.   ?Musculoskeletal: Negative.   ?Skin: Negative.   ?Neurological: Negative.   ?Psychiatric/Behavioral: Negative.    ? ?  ?Objective:  ?Physical Exam ?BP 129/77   Pulse 65   Wt 164 lb (74.4 kg)   BMI 27.29 kg/m?  ?Physical Exam ?Constitutional:   ?   Appearance: Normal appearance.  ?Cardiovascular:  ?   Rate and Rhythm: Normal rate.  ?Pulmonary:  ?   Effort: Pulmonary effort is normal.  ?Abdominal:  ?   General: Abdomen is flat. There is no distension.  ?   Palpations: There is no mass.  ?   Tenderness: There is no abdominal tenderness.  ?Skin: ?   General: Skin is warm.  ?Neurological:  ?   General: No focal deficit present.  ?   Mental Status: She is alert.  ?Psychiatric:     ?   Mood and Affect: Mood normal.  ?Breast exam: normal in size and symmetry, no lumps, masses, breasts are equal in size and shape.  ? ? ? ?Labs and Imaging ?No results found for this or any previous visit (from the past 24 hour(s)). ? ?No results  found. ? ?Health Maintenance Due  ?Topic Date Due  ? COVID-19 Vaccine (1) Never done  ? Hepatitis C Screening  Never done  ? Zoster Vaccines- Shingrix (1 of 2) Never done  ? Pneumonia Vaccine 59+ Years old (1 - PCV) Never done  ? DEXA SCAN  Never done  ? ? ? ?Assessment & Plan:  ? ?1. Well woman exam (no gynecological exam)   ?-mammo ordered per patient request ?-no pap smear today as patient has decided to stop with pap screenings, given normal history of pap and age ?-referral to internal medicine for cholesterol check as her records review shows she has history of hyperlipidemia ?-message sent to [facility] to assist with procuring pessary ; patient was informed it will be about 2 weeks before it arrives  ?-will order Dexa scan as it was ordered in 2019 and not completed  ?  ?Approximately 30 minutes of total time was spent with this patient on care ? ?Starr Lake, CNM ?12/06/2021 ?9:59 AM ? ?

## 2021-12-06 NOTE — Addendum Note (Signed)
Addended by: Carolynne Edouard on: 12/06/2021 01:27 PM ? ? Modules accepted: Orders ? ?

## 2021-12-10 ENCOUNTER — Encounter: Payer: Self-pay | Admitting: *Deleted

## 2021-12-20 ENCOUNTER — Other Ambulatory Visit (HOSPITAL_BASED_OUTPATIENT_CLINIC_OR_DEPARTMENT_OTHER): Payer: Medicare Other

## 2021-12-21 ENCOUNTER — Ambulatory Visit (HOSPITAL_BASED_OUTPATIENT_CLINIC_OR_DEPARTMENT_OTHER): Payer: Medicare Other | Admitting: Radiology

## 2022-01-03 ENCOUNTER — Ambulatory Visit
Admission: RE | Admit: 2022-01-03 | Discharge: 2022-01-03 | Disposition: A | Discharge status: Home / Self Care | Payer: Medicare Other | Source: Ambulatory Visit | Attending: Student | Admitting: Student

## 2022-01-03 DIAGNOSIS — Z0000 Encounter for general adult medical examination without abnormal findings: Secondary | ICD-10-CM

## 2022-05-17 ENCOUNTER — Ambulatory Visit (INDEPENDENT_AMBULATORY_CARE_PROVIDER_SITE_OTHER): Payer: Medicare Other | Admitting: Podiatry

## 2022-05-17 ENCOUNTER — Ambulatory Visit (INDEPENDENT_AMBULATORY_CARE_PROVIDER_SITE_OTHER): Payer: Medicare Other

## 2022-05-17 DIAGNOSIS — M216X2 Other acquired deformities of left foot: Secondary | ICD-10-CM

## 2022-05-17 DIAGNOSIS — M79672 Pain in left foot: Secondary | ICD-10-CM | POA: Diagnosis not present

## 2022-05-17 DIAGNOSIS — L84 Corns and callosities: Secondary | ICD-10-CM | POA: Diagnosis not present

## 2022-05-17 MED ORDER — MELOXICAM 15 MG PO TABS: 15.0000 mg | ORAL_TABLET | Freq: Every day | ORAL | 0 refills | Status: AC | PRN

## 2022-05-17 NOTE — Patient Instructions (Signed)
For shoes I would recommend going to [employer]. I think you may do well with a [employer] shoe.   Meloxicam Tablets What is this medication? MELOXICAM (mel OX i cam) treats mild to moderate pain, inflammation, or arthritis. It works by decreasing inflammation. It belongs to a group of medications called NSAIDs. This medicine may be used for other purposes; ask your health care provider or pharmacist if you have questions. COMMON BRAND NAME(S): Mobic What should I tell my care team before I take this medication? They need to know if you have any of these conditions: Asthma (lung or breathing disease) Bleeding disorder Coronary artery bypass graft (CABG) within the past 2 weeks Dehydration Heart attack Heart disease Heart failure High blood pressure If you often drink alcohol Kidney disease Liver disease Smoke tobacco cigarettes Stomach bleeding Stomach ulcers, other stomach or intestine problems Take medications that treat or prevent blood clots Taking other steroids like dexamethasone or prednisone An unusual or allergic reaction to meloxicam, other medications, foods, dyes, or preservatives Pregnant or trying to get pregnant Breast-feeding How should I use this medication? Take this medication by mouth. Take it as directed on the prescription label at the same time every day. You can take it with or without food. If it upsets your stomach, take it with food. Do not use it more often than directed. There may be unused or extra doses in the bottle after you finish your treatment. Talk to your care team if you have questions about your dose. A special MedGuide will be given to you by the pharmacist with each prescription and refill. Be sure to read this information carefully each time. Talk to your care team about the use of this medication in children. Special care may be needed. Patients over 59 years of age may have a stronger reaction and need a smaller dose. Overdosage: If you  think you have taken too much of this medicine contact a poison control center or emergency room at once. NOTE: This medicine is only for you. Do not share this medicine with others. What if I miss a dose? If you miss a dose, take it as soon as you can. If it is almost time for your next dose, take only that dose. Do not take double or extra doses. What may interact with this medication? Do not take this medication with any of the following: Cidofovir Ketorolac This medication may also interact with the following: Aspirin and aspirin-like medications Certain medications for blood pressure, heart disease, irregular heart beat Certain medications for depression, anxiety, or psychotic disturbances Certain medications that treat or prevent blood clots like warfarin, enoxaparin, dalteparin, apixaban, dabigatran, rivaroxaban Cyclosporine Diuretics Fluconazole Lithium Methotrexate Other NSAIDs, medications for pain and inflammation, like ibuprofen and naproxen Pemetrexed This list may not describe all possible interactions. Give your health care provider a list of all the medicines, herbs, non-prescription drugs, or dietary supplements you use. Also tell them if you smoke, drink alcohol, or use illegal drugs. Some items may interact with your medicine. What should I watch for while using this medication? Visit your care team for regular checks on your progress. Tell your care team if your symptoms do not start to get better or if they get worse. Do not take other medications that contain aspirin, ibuprofen, or naproxen with this medication. Side effects such as stomach upset, nausea, or ulcers may be more likely to occur. Many non-prescription medications contain aspirin, ibuprofen, or naproxen. Always read labels carefully. This  medication can cause serious ulcers and bleeding in the stomach. It can happen with no warning. Smoking, drinking alcohol, older age, and poor health can also increase  risks. Call your care team right away if you have stomach pain or blood in your vomit or stool. This medication does not prevent a heart attack or stroke. This medication may increase the chance of a heart attack or stroke. The chance may increase the longer you use this medication or if you have heart disease. If you take aspirin to prevent a heart attack or stroke, talk to your care team about using this medication. Alcohol may interfere with the effect of this medication. Avoid alcoholic drinks. This medication may cause serious skin reactions. They can happen weeks to months after starting the medication. Contact your care team right away if you notice fevers or flu-like symptoms with a rash. The rash may be red or purple and then turn into blisters or peeling of the skin. Or, you might notice a red rash with swelling of the face, lips or lymph nodes in your neck or under your arms. Talk to your care team if you are pregnant before taking this medication. Taking this medication between weeks 20 and 30 of pregnancy may harm your unborn baby. Your care team will monitor you closely if you need to take it. After 30 weeks of pregnancy, do not take this medication. You may get drowsy or dizzy. Do not drive, use machinery, or do anything that needs mental alertness until you know how this medication affects you. Do not stand up or sit up quickly, especially if you are an older patient. This reduces the risk of dizzy or fainting spells. Be careful brushing or flossing your teeth or using a toothpick because you may get an infection or bleed more easily. If you have any dental work done, tell your dentist you are receiving this medication. This medication may make it more difficult to get pregnant. Talk to your care team if you are concerned about your fertility. What side effects may I notice from receiving this medication? Side effects that you should report to your care team as soon as possible: Allergic  reactions--skin rash, itching, hives, swelling of the face, lips, tongue, or throat Bleeding--bloody or black, tar-like stools, vomiting blood or brown material that looks like coffee grounds, red or dark brown urine, small red or purple spots on skin, unusual bruising or bleeding Heart attack--pain or tightness in the chest, shoulders, arms, or jaw, nausea, shortness of breath, cold or clammy skin, feeling faint or lightheaded Heart failure--shortness of breath, swelling of ankles, feet, or hands, sudden weight gain, unusual weakness or fatigue Increase in blood pressure Kidney injury--decrease in the amount of urine, swelling of the ankles, hands, or feet Liver injury--right upper belly pain, loss of appetite, nausea, light-colored stool, dark yellow or brown urine, yellowing skin or eyes, unusual weakness or fatigue Rash, fever, and swollen lymph nodes Redness, blistering, peeling, or loosening of the skin, including inside the mouth Stroke--sudden numbness or weakness of the face, arm, or leg, trouble speaking, confusion, trouble walking, loss of balance or coordination, dizziness, severe headache, change in vision Side effects that usually do not require medical attention (report to your care team if they continue or are bothersome): Diarrhea Nausea Upset stomach This list may not describe all possible side effects. Call your doctor for medical advice about side effects. You may report side effects to FDA at 1-800-FDA-1088. Where should I keep my  medication? Keep out of the reach of children and pets. Store at room temperature between 20 and 25 degrees C (68 and 77 degrees F). Protect from moisture. Keep the container tightly closed. Get rid of any unused medication after the expiration date. To get rid of medications that are no longer needed or have expired: Take the medication to a medication take-back program. Check with your pharmacy or law enforcement to find a location. If you cannot  return the medication, check the label or package insert to see if the medication should be thrown out in the garbage or flushed down the toilet. If you are not sure, ask your care team. If it is safe to put it in the trash, empty the medication out of the container. Mix the medication with cat litter, dirt, coffee grounds, or other unwanted substance. Seal the mixture in a bag or container. Put it in the trash. NOTE: This sheet is a summary. It may not cover all possible information. If you have questions about this medicine, talk to your doctor, pharmacist, or health care provider.  2023 Elsevier/Gold Standard (2007-09-05 00:00:00)

## 2022-05-17 NOTE — Progress Notes (Signed)
Subjective:   Patient ID: Brittany Willis, female   DOB: 71 y.o.   MRN: [id number]   HPI Chief Complaint  Patient presents with   Callouses    Left foot callus x2 Started 2 years ago, over the summer the pain is worse, rate of pain 5 out of 10, X-Rays done today     It has been ongoing for a long time but it has been getting worse. No treatment. No injuries.    Review of Systems  All other systems reviewed and are negative.  Past Medical History:  Diagnosis Date   Allergy    Anemia    as teenager    Constipation    on and off- uses prn MOM or miralax   Cystocele with prolapse    Rectocele        Objective:  Physical Exam  ***     Assessment:  ***     Plan:  ***

## 2022-08-13 ENCOUNTER — Encounter: Payer: Self-pay | Admitting: Gastroenterology

## 2022-08-15 ENCOUNTER — Other Ambulatory Visit: Payer: Self-pay | Admitting: Internal Medicine

## 2022-08-16 LAB — COMPLETE METABOLIC PANEL WITH GFR
AG Ratio: 1.8 (calc) (ref 1.0–2.5)
ALT: 20 U/L (ref 6–29)
AST: 23 U/L (ref 10–35)
Albumin: 4.6 g/dL (ref 3.6–5.1)
Alkaline phosphatase (APISO): 57 U/L (ref 37–153)
BUN: 11 mg/dL (ref 7–25)
CO2: 24 mmol/L (ref 20–32)
Calcium: 9.8 mg/dL (ref 8.6–10.4)
Chloride: 104 mmol/L (ref 98–110)
Creat: 0.84 mg/dL (ref 0.60–1.00)
Globulin: 2.6 g/dL (calc) (ref 1.9–3.7)
Glucose, Bld: 97 mg/dL (ref 65–99)
Potassium: 4.5 mmol/L (ref 3.5–5.3)
Sodium: 142 mmol/L (ref 135–146)
Total Bilirubin: 0.5 mg/dL (ref 0.2–1.2)
Total Protein: 7.2 g/dL (ref 6.1–8.1)
eGFR: 75 mL/min/{1.73_m2} (ref >=60)

## 2022-08-16 LAB — CBC
HCT: 43.7 % (ref 35.0–45.0)
Hemoglobin: 14.4 g/dL (ref 11.7–15.5)
MCH: 27.7 pg (ref 27.0–33.0)
MCHC: 33 g/dL (ref 32.0–36.0)
MCV: 84.2 fL (ref 80.0–100.0)
MPV: 11.8 fL (ref 7.5–12.5)
Platelets: 218 10*3/uL (ref 140–400)
RBC: 5.19 10*6/uL — ABNORMAL HIGH (ref 3.80–5.10)
RDW: 13.6 % (ref 11.0–15.0)
WBC: 2.8 10*3/uL — ABNORMAL LOW (ref 3.8–10.8)

## 2022-08-16 LAB — VITAMIN D 25 HYDROXY (VIT D DEFICIENCY, FRACTURES): Vit D, 25-Hydroxy: 34 ng/mL (ref 30–100)

## 2022-08-16 LAB — LIPID PANEL
Cholesterol: 207 mg/dL — ABNORMAL HIGH (ref <200)
HDL: 63 mg/dL (ref >=50)
LDL Cholesterol (Calc): 123 mg/dL (calc) — ABNORMAL HIGH
Non-HDL Cholesterol (Calc): 144 mg/dL (calc) — ABNORMAL HIGH (ref <130)
Total CHOL/HDL Ratio: 3.3 (calc) (ref <5.0)
Triglycerides: 103 mg/dL (ref <150)

## 2022-08-28 ENCOUNTER — Encounter: Payer: Self-pay | Admitting: Gastroenterology

## 2022-09-18 ENCOUNTER — Ambulatory Visit (AMBULATORY_SURGERY_CENTER): Payer: 59 | Admitting: *Deleted

## 2022-09-18 VITALS — Ht 64.0 in | Wt 169.0 lb

## 2022-09-18 DIAGNOSIS — Z86010 Personal history of colonic polyps: Secondary | ICD-10-CM

## 2022-09-18 MED ORDER — NA SULFATE-K SULFATE-MG SULF 17.5-3.13-1.6 GM/177ML PO SOLN: 1.0000 | Freq: Once | ORAL | 0 refills | Status: AC

## 2022-09-18 NOTE — Progress Notes (Signed)
No egg or soy allergy known to patient  No issues known to pt with past sedation with any surgeries or procedures Patient denies ever being  with intubated No issues with moving head or neck or swallowing No FH of Malignant Hyperthermia Pt is not on diet pills Pt is not on  home 02  Pt is not on blood thinners  Pt does have issues with constipation  Instructed to use Miralax for this issue pt states she will Pt is not on dialysis Pt denies any upcoming cardiac testing Pt encouraged to use to use Singlecare or Goodrx to reduce cost  Patient's chart reviewed by Osvaldo Angst CNRA prior to previsit and patient appropriate for the [place].  Previsit completed and red dot placed by patient's name on their procedure day (on provider's schedule).  . Visit by phone Instructions reviewed with pt and pt states understanding. Instructed to review again prior to procedure. Pt states they will. Instructions sent by mail

## 2022-09-24 ENCOUNTER — Encounter: Payer: Self-pay | Admitting: Gastroenterology

## 2022-10-06 ENCOUNTER — Encounter: Payer: Self-pay | Admitting: Certified Registered Nurse Anesthetist

## 2022-10-09 ENCOUNTER — Encounter: Payer: 59 | Admitting: Gastroenterology

## 2022-10-09 ENCOUNTER — Telehealth: Payer: Self-pay | Admitting: Gastroenterology

## 2022-10-09 NOTE — Telephone Encounter (Signed)
Good Afternoon, Dr. Havery Moros,    We called this patient today at 10:45 am to see if the were coming to their procedure.  No one answered, so we left a message to call us if they were running late or needed to reschedule.   We will NO SHOW this patient.  [employer]

## 2022-10-09 NOTE — Telephone Encounter (Signed)
Thanks for letting me know, that is frustrating.

## 2022-12-31 ENCOUNTER — Other Ambulatory Visit: Payer: Self-pay | Admitting: Internal Medicine

## 2022-12-31 DIAGNOSIS — Z1231 Encounter for screening mammogram for malignant neoplasm of breast: Secondary | ICD-10-CM

## 2023-01-21 ENCOUNTER — Ambulatory Visit
Admission: RE | Admit: 2023-01-21 | Discharge: 2023-01-21 | Disposition: A | Discharge status: Home / Self Care | Payer: 59 | Source: Ambulatory Visit | Attending: Internal Medicine | Admitting: Internal Medicine

## 2023-01-21 DIAGNOSIS — Z1231 Encounter for screening mammogram for malignant neoplasm of breast: Secondary | ICD-10-CM

## 2023-05-27 ENCOUNTER — Ambulatory Visit (AMBULATORY_SURGERY_CENTER): Payer: 59 | Admitting: *Deleted

## 2023-05-27 VITALS — Ht 65.0 in | Wt 163.0 lb

## 2023-05-27 DIAGNOSIS — Z860100 Personal history of colon polyps, unspecified: Secondary | ICD-10-CM

## 2023-05-27 DIAGNOSIS — Z800 Family history of malignant neoplasm of digestive organs: Secondary | ICD-10-CM

## 2023-05-27 NOTE — Progress Notes (Signed)
Pt's name and DOB verified at the beginning of the pre-visit wit 2 identifiers  Pt denies any difficulty with ambulating,sitting, laying down or rolling side to side  Gave both [facility] main # and MD on call # prior to instructions.   No egg or soy allergy known to patient   No issues known to pt with past sedation with any surgeries or procedures  Patient denies ever being intubated  Pt has no issues moving head neck or swallowing  No FH of Malignant Hyperthermia  Pt is not on diet pills or shots  Pt is not on home 02   Pt is not on blood thinners   Pt has frequent issues with constipation RN instructed pt to use Miralax per bottles instructions a week before prep days. Pt states they will  Pt is not on dialysis  Pt denise any abnormal heart rhythms   Pt denies any upcoming cardiac testing  Pt encouraged to use to use Singlecare or Goodrx to reduce cost   Patient's chart reviewed by Cathlyn Parsons CNRA prior to pre-visit and patient appropriate for the [facility].  Pre-visit completed and red dot placed by patient's name on their procedure day (on provider's schedule).  .  Visit by phone  Pt states weight is 163 lb  Instructed pt why it is important to and  to call if they have any changes in health or new medications. Directed them to the # given and on instructions.     Instructions reviewed with pt and pt states understanding. Instructed to review again prior to procedure. Pt states they will.   Instructions sent by mail with coupon and by my chart  Pt already has prep at home

## 2023-06-24 ENCOUNTER — Encounter: Payer: 59 | Admitting: Gastroenterology

## 2023-06-24 ENCOUNTER — Encounter: Payer: Self-pay | Admitting: Gastroenterology

## 2023-06-24 ENCOUNTER — Ambulatory Visit: Payer: 59 | Admitting: Gastroenterology

## 2023-06-24 VITALS — BP 115/67 | HR 67 | Temp 97.2°F | Resp 11 | Ht 65.0 in | Wt 163.0 lb

## 2023-06-24 DIAGNOSIS — D120 Benign neoplasm of cecum: Secondary | ICD-10-CM | POA: Diagnosis not present

## 2023-06-24 DIAGNOSIS — K648 Other hemorrhoids: Secondary | ICD-10-CM

## 2023-06-24 DIAGNOSIS — K5730 Diverticulosis of large intestine without perforation or abscess without bleeding: Secondary | ICD-10-CM

## 2023-06-24 DIAGNOSIS — Z860100 Personal history of colon polyps, unspecified: Secondary | ICD-10-CM

## 2023-06-24 DIAGNOSIS — D123 Benign neoplasm of transverse colon: Secondary | ICD-10-CM | POA: Diagnosis not present

## 2023-06-24 DIAGNOSIS — Z1211 Encounter for screening for malignant neoplasm of colon: Secondary | ICD-10-CM | POA: Diagnosis not present

## 2023-06-24 DIAGNOSIS — D122 Benign neoplasm of ascending colon: Secondary | ICD-10-CM

## 2023-06-24 DIAGNOSIS — Z800 Family history of malignant neoplasm of digestive organs: Secondary | ICD-10-CM

## 2023-06-24 DIAGNOSIS — D125 Benign neoplasm of sigmoid colon: Secondary | ICD-10-CM

## 2023-06-24 DIAGNOSIS — Z860101 Personal history of adenomatous and serrated colon polyps: Secondary | ICD-10-CM

## 2023-06-24 DIAGNOSIS — K635 Polyp of colon: Secondary | ICD-10-CM | POA: Diagnosis not present

## 2023-06-24 DIAGNOSIS — K6389 Other specified diseases of intestine: Secondary | ICD-10-CM

## 2023-06-24 MED ORDER — SODIUM CHLORIDE 0.9 % IV SOLN: 500.0000 mL | Freq: Once | INTRAVENOUS | Status: AC

## 2023-06-24 NOTE — Patient Instructions (Signed)
Resume previous diet and medications. Awaiting pathology results. Repeat Colonoscopy date to be determined based on pathology results. Handouts provided on Colon polyps, Diverticulosis and Hemorrhoids   YOU HAD AN ENDOSCOPIC PROCEDURE TODAY AT [facility]:   Refer to the procedure report that was given to you for any specific questions about what was found during the examination.  If the procedure report does not answer your questions, please call your gastroenterologist to clarify.  If you requested that your care partner not be given the details of your procedure findings, then the procedure report has been included in a sealed envelope for you to review at your convenience later.  YOU SHOULD EXPECT: Some feelings of bloating in the abdomen. Passage of more gas than usual.  Walking can help get rid of the air that was put into your GI tract during the procedure and reduce the bloating. If you had a lower endoscopy (such as a colonoscopy or flexible sigmoidoscopy) you may notice spotting of blood in your stool or on the toilet paper. If you underwent a bowel prep for your procedure, you may not have a normal bowel movement for a few days.  Please Note:  You might notice some irritation and congestion in your nose or some drainage.  This is from the oxygen used during your procedure.  There is no need for concern and it should clear up in a day or so.  SYMPTOMS TO REPORT IMMEDIATELY:  Following lower endoscopy (colonoscopy or flexible sigmoidoscopy):  Excessive amounts of blood in the stool  Significant tenderness or worsening of abdominal pains  Swelling of the abdomen that is new, acute  Fever of 100F or higher  For urgent or emergent issues, a gastroenterologist can be reached at any hour by calling [phone]. Do not use MyChart messaging for urgent concerns.    DIET:  We do recommend a small meal at first, but then you may proceed to your regular diet.  Drink plenty of  fluids but you should avoid alcoholic beverages for 24 hours.  ACTIVITY:  You should plan to take it easy for the rest of today and you should NOT DRIVE or use heavy machinery until tomorrow (because of the sedation medicines used during the test).    FOLLOW UP: Our staff will call the number listed on your records the next business day following your procedure.  We will call around 7:15- 8:00 am to check on you and address any questions or concerns that you may have regarding the information given to you following your procedure. If we do not reach you, we will leave a message.     If any biopsies were taken you will be contacted by phone or by letter within the next 1-3 weeks.  Please call us at [phone] if you have not heard about the biopsies in 3 weeks.    SIGNATURES/CONFIDENTIALITY: You and/or your care partner have signed paperwork which will be entered into your electronic medical record.  These signatures attest to the fact that that the information above on your After Visit Summary has been reviewed and is understood.  Full responsibility of the confidentiality of this discharge information lies with you and/or your care-partner.

## 2023-06-24 NOTE — Progress Notes (Signed)
Called to room to assist during endoscopic procedure.  Patient ID and intended procedure confirmed with present staff. Received instructions for my participation in the procedure from the performing physician.  

## 2023-06-24 NOTE — Op Note (Signed)
[facility] Patient Name: Brittany Willis Procedure Date: 06/24/2023 8:33 AM MRN: [id number] Endoscopist: Viviann Spare P. Adela Lank , MD, [id number] Age: 72 Referring MD:  Date of Birth: [birth year] Gender: Female Account #: [id number] Procedure:                Colonoscopy Indications:              High risk colon cancer surveillance: Personal                            history of colonic polyps - 2 adenomas removed                            07/2017, also with strong family history of colon                            cancer (mother dx around age 75) Medicines:                Monitored Anesthesia Care Procedure:                Pre-Anesthesia Assessment:                           - Prior to the procedure, a History and Physical                            was performed, and patient medications and                            allergies were reviewed. The patient's tolerance of                            previous anesthesia was also reviewed. The risks                            and benefits of the procedure and the sedation                            options and risks were discussed with the patient.                            All questions were answered, and informed consent                            was obtained. Prior Anticoagulants: The patient has                            taken no anticoagulant or antiplatelet agents. ASA                            Grade Assessment: II - A patient with mild systemic                            disease. After reviewing the risks and benefits,  the patient was deemed in satisfactory condition to                            undergo the procedure.                           After obtaining informed consent, the colonoscope                            was passed under direct vision. Throughout the                            procedure, the patient's blood pressure, pulse, and                            oxygen saturations were  monitored continuously. The                            Olympus Scope SN [id number] was introduced through the                            anus and advanced to the the cecum, identified by                            appendiceal orifice and ileocecal valve. The                            colonoscopy was performed without difficulty. The                            patient tolerated the procedure well. The quality                            of the bowel preparation was good. The ileocecal                            valve, appendiceal orifice, and rectum were                            photographed. Scope In: 8:48:17 AM Scope Out: 9:04:28 AM Scope Withdrawal Time: 0 hours 13 minutes 27 seconds  Total Procedure Duration: 0 hours 16 minutes 11 seconds  Findings:                 The perianal and digital rectal examinations were                            normal.                           Three sessile polyps were found in the cecum. The                            polyps were diminutive in size. These polyps were  removed with a cold snare. Resection and retrieval                            were complete.                           A 3 mm polyp was found in the ascending colon. The                            polyp was sessile. The polyp was removed with a                            cold snare. Resection and retrieval were complete.                           A 4 mm polyp was found in the transverse colon. The                            polyp was sessile. The polyp was removed with a                            cold snare. Resection and retrieval were complete.                           A 3 mm polyp was found in the sigmoid colon. The                            polyp was sessile. The polyp was removed with a                            cold snare. Resection and retrieval were complete.                           Multiple small-mouthed diverticula were found in                             the sigmoid colon.                           Internal hemorrhoids were found during retroflexion.                           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                           The exam was otherwise without abnormality. Complications:            No immediate complications. Estimated blood loss:                            Minimal. Estimated Blood Loss:  Estimated blood loss was minimal. Impression:               - Three diminutive polyps in the cecum, removed                            with a cold snare. Resected and retrieved.                           - One 3 mm polyp in the ascending colon, removed                            with a cold snare. Resected and retrieved.                           - One 4 mm polyp in the transverse colon, removed                            with a cold snare. Resected and retrieved.                           - One 3 mm polyp in the sigmoid colon, removed with                            a cold snare. Resected and retrieved.                           - Diverticulosis in the sigmoid colon.                           - Internal hemorrhoids.                           - Melanosis coli.                           - The examination was otherwise normal. Recommendation:           - Patient has a contact number available for                            emergencies. The signs and symptoms of potential                            delayed complications were discussed with the                            patient. Return to normal activities tomorrow.                            Written discharge instructions were provided to the                            patient.                           -  Resume previous diet.                           - Continue present medications.                           - Await pathology results. Viviann Spare P. Lawrence Roldan, MD 06/24/2023 9:09:45 AM This report has been signed electronically.

## 2023-06-24 NOTE — Progress Notes (Signed)
Pt's states no medical or surgical changes since previsit or office visit. 

## 2023-06-24 NOTE — Progress Notes (Signed)
Sedate, gd SR, tolerated procedure well, VSS, report to RN 

## 2023-06-24 NOTE — Progress Notes (Signed)
[facility] History and Physical   Primary Care Physician:  Fleet Contras, MD   Reason for Procedure:   History of colon polyps, FH of CRC  Plan:    colonoscopy     HPI: Brittany Willis is a 72 y.o. female  here for colonoscopy surveillance - 2 adenomas removed 07/2017. Mother had CRC dx around age 75. Marland Kitchen Patient denies any bowel symptoms at this time. Otherwise feels well without any cardiopulmonary symptoms.   I have discussed risks / benefits of anesthesia and endoscopic procedure with [name] and they wish to proceed with the exams as outlined today.    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 Authorizing Provider  Cholecalciferol (VITAMIN D PO) Take 5,000 Units by mouth daily.   Yes [provider]  Flaxseed, Linseed, (FLAX SEED OIL) 1000 MG CAPS Take 1,000 mg by mouth daily.   Yes [provider]  Lactobacillus (ACIDOPHILUS) 100 MG CAPS Take by mouth.   Yes [provider]  loteprednol (LOTEMAX) 0.2 % SUSP 1 drop 4 (four) times daily.   Yes [provider]  magnesium hydroxide (MILK OF MAGNESIA) 400 MG/5ML suspension Take by mouth daily as needed for mild constipation.   Yes [provider]  Melatonin 5 MG CAPS Take 5 mg by mouth at bedtime.   Yes [provider]  Misc. Devices (RING PESSARY/SUPPORT) MISC 1 Units by Does not apply route as needed. 04/12/20  Yes Conan Bowens, MD  Multiple Vitamin (MULTIVITAMIN) capsule Take 1 capsule by mouth daily.   Yes [provider]  Olopatadine HCl 0.7  % SOLN Apply to eye.   Yes [provider]  Rhubarb (ESTROVEN MENOPAUSE RELIEF PO) Take by mouth.   Yes [provider]  vitamin C (ASCORBIC ACID) 500 MG tablet Take 500 mg by mouth daily.   Yes [provider]  cetirizine (ZYRTEC) 10 MG tablet Take 10 mg by mouth daily. Patient not taking: Reported on 06/24/2023    [provider]  meloxicam (MOBIC) 15 MG tablet Take 15 mg by mouth daily. Patient not taking: Reported on 06/24/2023 05/21/23   [provider]  polyethylene glycol powder (GLYCOLAX/MIRALAX) powder TAKE 17 G BY MOUTH DAILY. Patient not taking: Reported on 06/24/2023 07/04/15   Ambrose Finland, NP    Current Outpatient Medications  Medication Sig Dispense Refill   Cholecalciferol (VITAMIN D PO) Take 5,000 Units by mouth daily.     Flaxseed, Linseed, (FLAX SEED OIL) 1000 MG CAPS Take 1,000 mg by mouth daily.     Lactobacillus (ACIDOPHILUS) 100 MG CAPS Take by mouth.     loteprednol (LOTEMAX) 0.2 % SUSP 1 drop 4 (four) times daily.     magnesium hydroxide (MILK OF MAGNESIA) 400 MG/5ML suspension Take by mouth daily as needed for mild constipation.     Melatonin 5 MG CAPS Take 5 mg by mouth at bedtime.     Misc. Devices (RING  PESSARY/SUPPORT) MISC 1 Units by Does not apply route as needed. 1 each 0   Multiple Vitamin (MULTIVITAMIN) capsule Take 1 capsule by mouth daily.     Olopatadine HCl 0.7 % SOLN Apply to eye.     Rhubarb (ESTROVEN MENOPAUSE RELIEF PO) Take by mouth.     vitamin C (ASCORBIC ACID) 500 MG tablet Take 500 mg by mouth daily.     cetirizine (ZYRTEC) 10 MG tablet Take 10 mg by mouth daily. (Patient not taking: Reported on 06/24/2023)     meloxicam (MOBIC) 15 MG tablet Take 15 mg by mouth daily. (Patient not taking: Reported on 06/24/2023)     polyethylene glycol powder (GLYCOLAX/MIRALAX) powder TAKE 17 G BY MOUTH DAILY. (Patient not taking: Reported on 06/24/2023) 850 g 3   Current Facility-Administered Medications   Medication Dose Route Frequency Provider Last Rate Last Admin   0.9 %  sodium chloride infusion  500 mL Intravenous Once Luverne Zerkle, Willaim Rayas, MD       0.9 %  sodium chloride infusion  500 mL Intravenous Once Wacey Zieger, Willaim Rayas, MD        Allergies as of 06/24/2023 - Review Complete 06/24/2023  Allergen Reaction Noted   Aspirin Swelling 02/18/2013    Family History  Problem Relation Age of Onset   Cancer Mother    Colon cancer Mother    Colon polyps Neg Hx    Esophageal cancer Neg Hx    Rectal cancer Neg Hx    Stomach cancer Neg Hx     Social History   Socioeconomic History   Marital status: Married    Spouse name: Not on file   Number of children: Not on file   Years of education: Not on file   Highest education level: Not on file  Occupational History   Not on file  Tobacco Use   Smoking status: Never   Smokeless tobacco: Never  Substance and Sexual Activity   Alcohol use: No   Drug use: No   Sexual activity: Not Currently    Birth control/protection: Surgical  Other Topics Concern   Not on file  Social History Narrative   Not on file   Social Determinants of Health   Financial Resource Strain: Not on file  Food Insecurity: Not on file  Transportation Needs: Not on file  Physical Activity: Not on file  Stress: Not on file  Social Connections: Not on file  Intimate Partner Violence: Not on file    Review of Systems: All other review of systems negative except as mentioned in the HPI.  Physical Exam: Vital signs BP 138/76   Pulse (!) 56   Temp (!) 97.2 F (36.2 C)   Ht 5\' 5"  (1.651 m)   Wt 163 lb (73.9 kg)   SpO2 99%   BMI 27.12 kg/m   General:   Alert,  Well-developed, pleasant and cooperative in NAD Lungs:  Clear throughout to auscultation.   Heart:  Regular rate and rhythm Abdomen:  Soft, nontender and nondistended.   Neuro/Psych:  Alert and cooperative. Normal mood and affect. A and O x 3  Harlin Rain, MD [facility]

## 2023-06-25 ENCOUNTER — Telehealth: Payer: Self-pay | Admitting: *Deleted

## 2023-06-25 NOTE — Telephone Encounter (Signed)
  Follow up Call-     06/24/2023    7:52 AM  Call back number  Post procedure Call Back phone  # [phone]  Permission to leave phone message Yes     Patient questions:  Do you have a fever, pain , or abdominal swelling? No. Pain Score  0 *  Have you tolerated food without any problems? Yes.    Have you been able to return to your normal activities? Yes.    Do you have any questions about your discharge instructions: Diet   No. Medications  No. Follow up visit  No.  Do you have questions or concerns about your Care? No.  Actions: * If pain score is 4 or above: No action needed, pain <4.

## 2023-06-30 LAB — SURGICAL PATHOLOGY

## 2023-07-03 ENCOUNTER — Encounter: Payer: Self-pay | Admitting: Gastroenterology

## 2023-10-30 ENCOUNTER — Other Ambulatory Visit: Payer: Self-pay | Admitting: Internal Medicine

## 2023-10-30 DIAGNOSIS — Z0000 Encounter for general adult medical examination without abnormal findings: Secondary | ICD-10-CM

## 2024-01-21 ENCOUNTER — Ambulatory Visit

## 2024-01-23 ENCOUNTER — Ambulatory Visit
Admission: RE | Admit: 2024-01-23 | Discharge: 2024-01-23 | Disposition: A | Discharge status: Home / Self Care | Source: Ambulatory Visit | Attending: Internal Medicine | Admitting: Internal Medicine

## 2024-01-23 DIAGNOSIS — Z0000 Encounter for general adult medical examination without abnormal findings: Secondary | ICD-10-CM

## 2024-03-28 IMAGING — MG MM DIGITAL SCREENING BILAT W/ TOMO AND CAD
8 series · 8 of 24 positions shown · non-contrast
Comparison: Previous exam(s).

ACR Breast Density Category a: The breast tissue is almost entirely
fatty.

CLINICAL DATA: Screening.

EXAM:
DIGITAL SCREENING BILATERAL MAMMOGRAM WITH TOMOSYNTHESIS AND CAD
TECHNIQUE: Bilateral screening digital craniocaudal and mediolateral oblique
mammograms were obtained. Bilateral screening digital breast
tomosynthesis was performed. The images were evaluated with
computer-aided detection.

[L CC synth-2D]
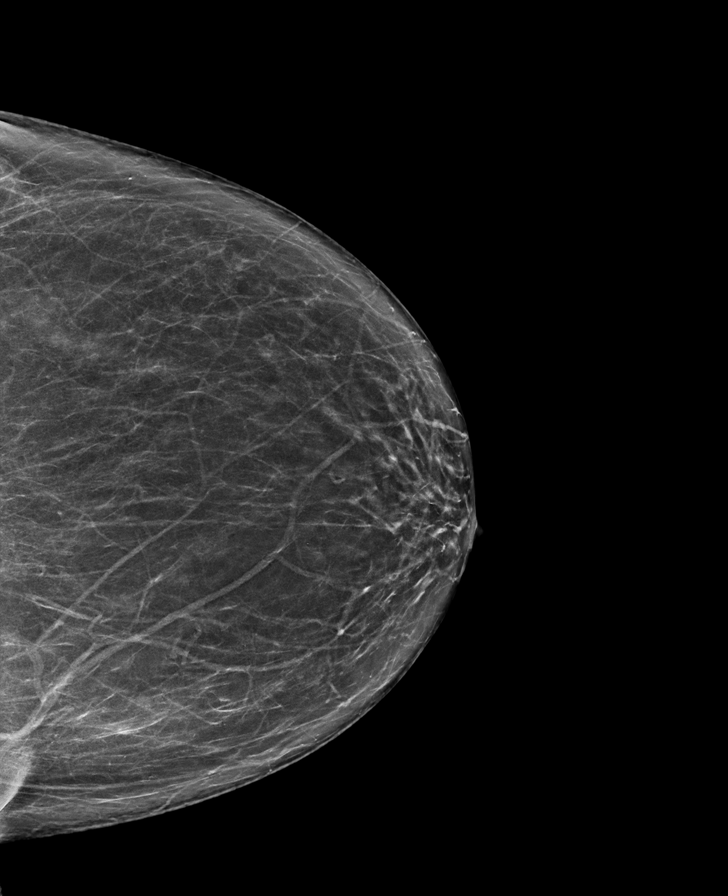

[R MLO synth-2D]
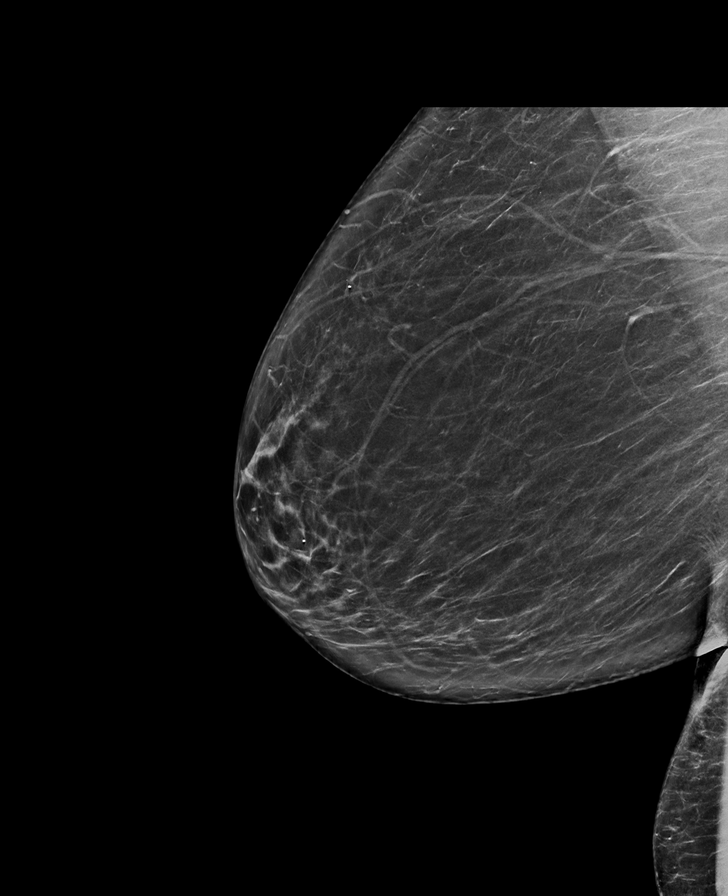

[L MLO synth-2D]
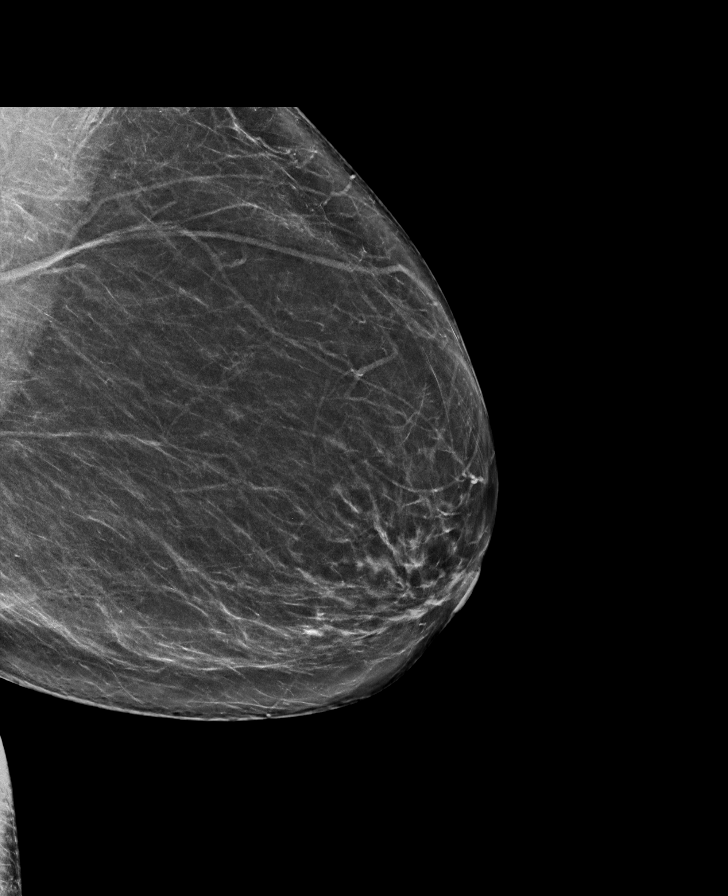

[R CC synth-2D]
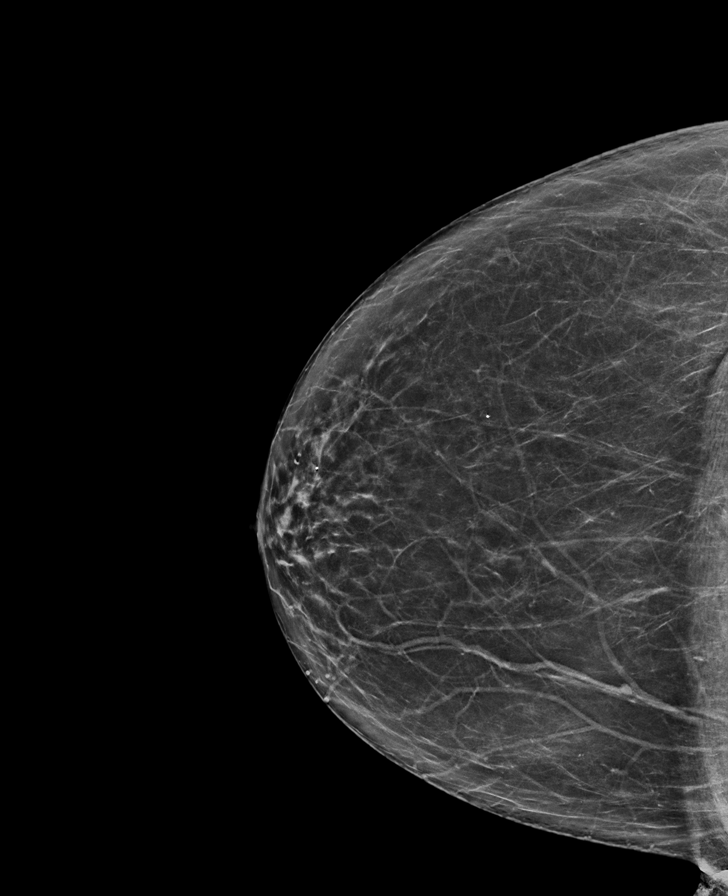

[L MLO tomo · tomo slice 42/83.0]
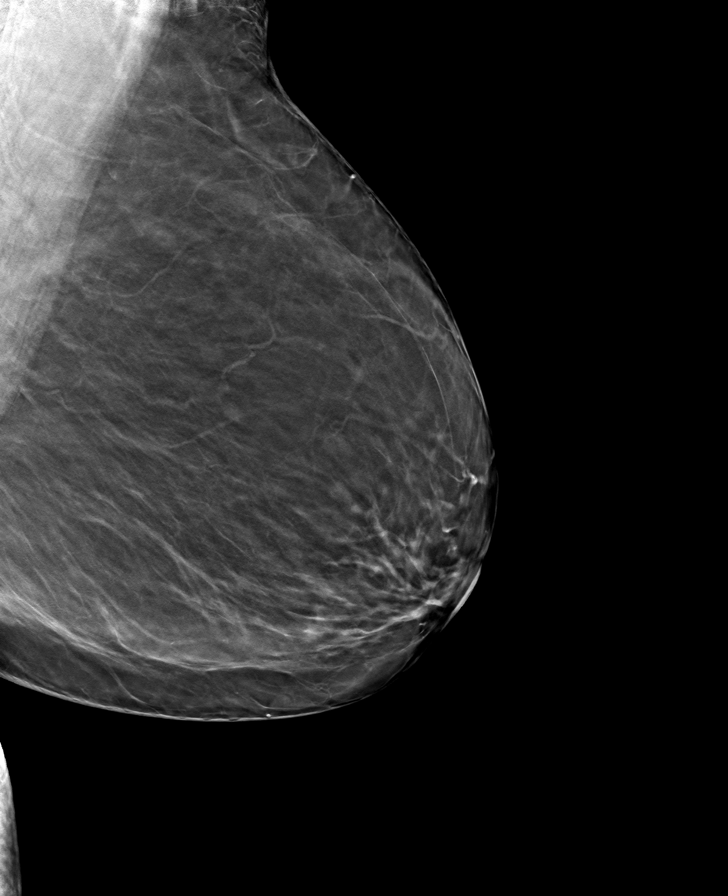

[R CC tomo · tomo slice 35/70.0]
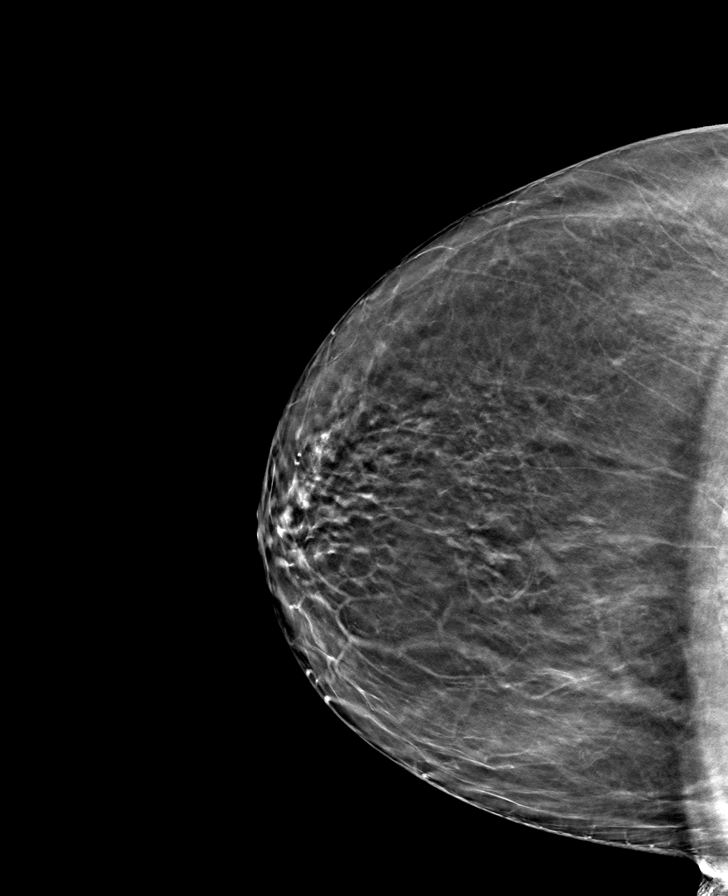

[R MLO tomo · tomo slice 41/81.0]
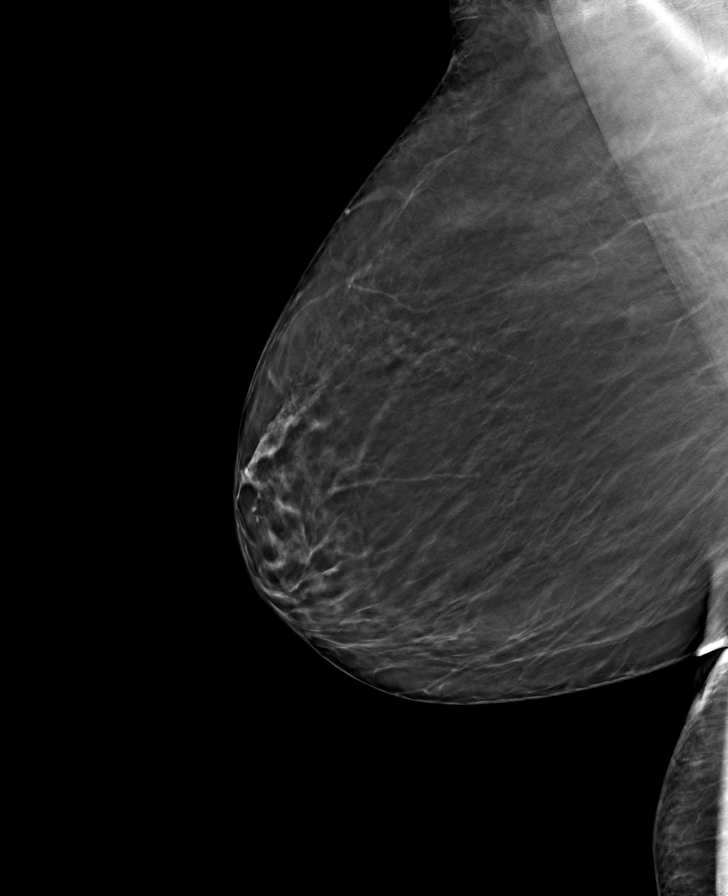

[L CC tomo · tomo slice 38/75.0]
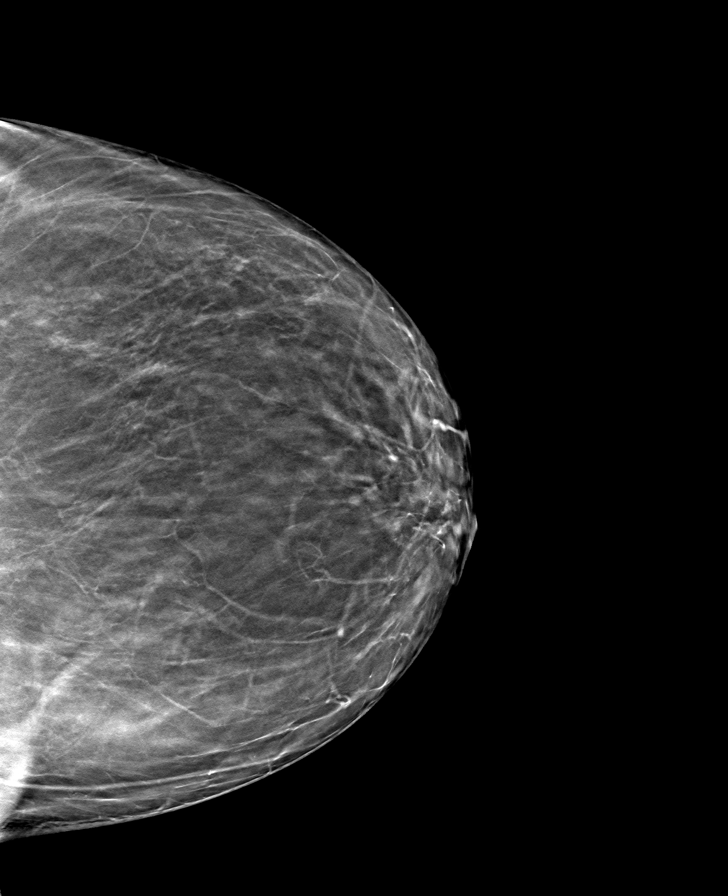

[8 of 24 positions shown; findings below may reference images not displayed]

FINDINGS: There are no findings suspicious for malignancy.
IMPRESSION: No mammographic evidence of malignancy. A result letter of this
screening mammogram will be mailed directly to the patient.

RECOMMENDATION:
Screening mammogram in one year. (Code:0E-3-N98)

BI-RADS CATEGORY  1: Negative.
